# Patient Record
Sex: Male | Born: 1970 | Race: White | Hispanic: No | Marital: Married | Smoking: Never smoker
Health system: Southern US, Community
[De-identification: ages and names within clinical notes are randomized; demographics above are authoritative.]

## PROBLEM LIST (undated history)

## (undated) DIAGNOSIS — F419 Anxiety disorder, unspecified: Secondary | ICD-10-CM

## (undated) DIAGNOSIS — K5792 Diverticulitis of intestine, part unspecified, without perforation or abscess without bleeding: Secondary | ICD-10-CM

## (undated) DIAGNOSIS — K219 Gastro-esophageal reflux disease without esophagitis: Secondary | ICD-10-CM

## (undated) DIAGNOSIS — Z9289 Personal history of other medical treatment: Secondary | ICD-10-CM

## (undated) DIAGNOSIS — E119 Type 2 diabetes mellitus without complications: Secondary | ICD-10-CM

## (undated) DIAGNOSIS — I10 Essential (primary) hypertension: Secondary | ICD-10-CM

## (undated) DIAGNOSIS — E7800 Pure hypercholesterolemia, unspecified: Secondary | ICD-10-CM

## (undated) HISTORY — PX: NO PAST SURGERIES: SHX2092

## (undated) HISTORY — DX: Personal history of other medical treatment: Z92.89

## (undated) HISTORY — DX: Anxiety disorder, unspecified: F41.9

## (undated) HISTORY — DX: Diverticulitis of intestine, part unspecified, without perforation or abscess without bleeding: K57.92

## (undated) HISTORY — DX: Essential (primary) hypertension: I10

## (undated) HISTORY — DX: Gastro-esophageal reflux disease without esophagitis: K21.9

## (undated) HISTORY — DX: Pure hypercholesterolemia, unspecified: E78.00

## (undated) HISTORY — DX: Type 2 diabetes mellitus without complications: E11.9

---

## 2004-03-12 ENCOUNTER — Emergency Department: Payer: Self-pay | Admitting: Unknown Physician Specialty

## 2004-03-12 ENCOUNTER — Other Ambulatory Visit: Payer: Self-pay

## 2007-09-05 ENCOUNTER — Ambulatory Visit: Payer: Self-pay | Admitting: Internal Medicine

## 2008-12-14 ENCOUNTER — Ambulatory Visit: Payer: Self-pay | Admitting: Internal Medicine

## 2011-09-01 ENCOUNTER — Encounter: Payer: Self-pay | Admitting: Internal Medicine

## 2011-09-01 ENCOUNTER — Ambulatory Visit (INDEPENDENT_AMBULATORY_CARE_PROVIDER_SITE_OTHER): Payer: BC Managed Care – PPO | Admitting: Internal Medicine

## 2011-09-01 VITALS — BP 130/98 | HR 75 | Temp 99.2°F | Ht 74.0 in | Wt 262.5 lb

## 2011-09-01 DIAGNOSIS — I10 Essential (primary) hypertension: Secondary | ICD-10-CM

## 2011-09-01 DIAGNOSIS — F411 Generalized anxiety disorder: Secondary | ICD-10-CM

## 2011-09-01 DIAGNOSIS — K219 Gastro-esophageal reflux disease without esophagitis: Secondary | ICD-10-CM | POA: Insufficient documentation

## 2011-09-01 DIAGNOSIS — F419 Anxiety disorder, unspecified: Secondary | ICD-10-CM | POA: Insufficient documentation

## 2011-09-01 DIAGNOSIS — Z23 Encounter for immunization: Secondary | ICD-10-CM

## 2011-09-01 DIAGNOSIS — E785 Hyperlipidemia, unspecified: Secondary | ICD-10-CM

## 2011-09-01 DIAGNOSIS — E669 Obesity, unspecified: Secondary | ICD-10-CM

## 2011-09-01 DIAGNOSIS — Z125 Encounter for screening for malignant neoplasm of prostate: Secondary | ICD-10-CM

## 2011-09-01 LAB — COMPREHENSIVE METABOLIC PANEL
ALT: 29 U/L (ref 0–53)
AST: 23 U/L (ref 0–37)
Albumin: 4.3 g/dL (ref 3.5–5.2)
Alkaline Phosphatase: 56 U/L (ref 39–117)
BUN: 20 mg/dL (ref 6–23)
CO2: 25 mEq/L (ref 19–32)
Calcium: 9.8 mg/dL (ref 8.4–10.5)
Chloride: 103 mEq/L (ref 96–112)
Creatinine, Ser: 1 mg/dL (ref 0.4–1.5)
GFR: 90.68 mL/min (ref >60.00)
Glucose, Bld: 98 mg/dL (ref 70–99)
Potassium: 4.4 mEq/L (ref 3.5–5.1)
Sodium: 139 mEq/L (ref 135–145)
Total Bilirubin: 0.9 mg/dL (ref 0.3–1.2)
Total Protein: 7.1 g/dL (ref 6.0–8.3)

## 2011-09-01 LAB — MICROALBUMIN / CREATININE URINE RATIO
Creatinine,U: 115 mg/dL
Microalb Creat Ratio: 1 mg/g (ref 0.0–30.0)
Microalb, Ur: 1.1 mg/dL (ref 0.0–1.9)

## 2011-09-01 LAB — LIPID PANEL
Cholesterol: 205 mg/dL — ABNORMAL HIGH (ref 0–200)
HDL: 37.9 mg/dL — ABNORMAL LOW (ref >39.00)
Total CHOL/HDL Ratio: 5
Triglycerides: 193 mg/dL — ABNORMAL HIGH (ref 0.0–149.0)
VLDL: 38.6 mg/dL (ref 0.0–40.0)

## 2011-09-01 LAB — PSA: PSA: 0.35 ng/mL (ref 0.10–4.00)

## 2011-09-01 LAB — LDL CHOLESTEROL, DIRECT: Direct LDL: 122.2 mg/dL

## 2011-09-01 MED ORDER — ALPRAZOLAM 0.25 MG PO TABS: 0.2500 mg | ORAL_TABLET | Freq: Three times a day (TID) | ORAL | Status: AC | PRN

## 2011-09-01 MED ORDER — ALPRAZOLAM 0.25 MG PO TABS: 0.2500 mg | ORAL_TABLET | Freq: Two times a day (BID) | ORAL | Status: AC | PRN

## 2011-09-01 MED ORDER — LISINOPRIL 20 MG PO TABS: 20.0000 mg | ORAL_TABLET | Freq: Every day | ORAL | Status: DC

## 2011-09-01 MED ORDER — OMEPRAZOLE-SODIUM BICARBONATE 40-1100 MG PO CAPS: 1.0000 | ORAL_CAPSULE | Freq: Every day | ORAL | Status: DC

## 2011-09-01 NOTE — Assessment & Plan Note (Signed)
Blood pressure slightly elevated today. Will try changing to lisinopril 20 mg daily. We'll check renal function with labs today. Patient will e-mail with blood pressures over the next couple of weeks. Followup in one month.

## 2011-09-01 NOTE — Assessment & Plan Note (Signed)
Will check lipids with labs today.  

## 2011-09-01 NOTE — Assessment & Plan Note (Signed)
Patient with history of GERD. Will continue Zegerid as this has worked well for him. Will obtain records on previous evaluation.

## 2011-09-01 NOTE — Patient Instructions (Signed)
[email or web address]

## 2011-09-01 NOTE — Assessment & Plan Note (Signed)
BMI 33. Encouraged efforts at healthy diet including increased fiber and lower saturated fat. Encouraged regular physical activity with a goal of 30 minutes of aerobic exercise daily. Encouraged him to consider using on line application to help monitor caloric intake. Followup one month.

## 2011-09-01 NOTE — Telephone Encounter (Signed)
Rx for Alprazolam called to [facility].

## 2011-09-01 NOTE — Assessment & Plan Note (Signed)
Patient with history of anxiety and panic disorder. Will use Xanax as needed for severe episodes of panic.

## 2011-09-01 NOTE — Progress Notes (Signed)
Subjective:    Patient ID: Todd Willis, male    DOB: September 09, 1970, 41 y.o.   MRN: [id number]  HPI 41 year old male with history of hypertension, anxiety, GERD presents to establish care. In regards to his hypertension, he reports that he is recently taking lisinopril hydrochlorothiazide. He does not regularly check his blood pressure. He denies any chest pain, palpitations, headache. He notes that on this medication he is urinating frequently, especially at night up to every 2 hours. He has not had kidney function checked in at least one year.  In regards to her anxiety, he notes significant stress at work. He has had panic attacks in the past for which she has used Xanax with some improvement. He has also been on SSRIs, but did not tolerate well secondary to side effects. He notes that her anxiety level is improved with increase physical activity.  He notes that recently he has been trying to lose weight and be more physically active. He reports that exercise is limited by his work schedule. He tries to use the elliptical for 30 minutes 3 times per week. He is also trying to improve his diet by increasing fiber and decreasing saturated fat.  Outpatient Encounter Prescriptions as of 09/01/2011  Medication Sig Dispense Refill  . ALPRAZolam (XANAX) 0.25 MG tablet Take 1 tablet (0.25 mg total) by mouth 3 (three) times daily as needed for anxiety.  30 tablet  0  . lisinopril (PRINIVIL,ZESTRIL) 20 MG tablet Take 1 tablet (20 mg total) by mouth daily.  90 tablet  3  . Multiple Vitamin (MULTIVITAMIN) tablet Take 1 tablet by mouth daily.      Marland Kitchen omeprazole-sodium bicarbonate (ZEGERID) 40-1100 MG per capsule Take 1 capsule by mouth daily before breakfast.  30 capsule  11  . DISCONTD: lisinopril-hydrochlorothiazide (PRINZIDE,ZESTORETIC) 10-12.5 MG per tablet Take 1 tablet by mouth daily.        Review of Systems  Constitutional: Negative for fever, chills, activity change, appetite change, fatigue and  unexpected weight change.  Eyes: Negative for visual disturbance.  Respiratory: Negative for cough and shortness of breath.   Cardiovascular: Negative for chest pain, palpitations and leg swelling.  Gastrointestinal: Positive for abdominal pain (intermittent epigastric). Negative for abdominal distention.  Genitourinary: Negative for dysuria, urgency and difficulty urinating.  Musculoskeletal: Negative for arthralgias and gait problem.  Skin: Negative for color change and rash.  Hematological: Negative for adenopathy.  Psychiatric/Behavioral: Negative for disturbed wake/sleep cycle and dysphoric mood. The patient is nervous/anxious.    BP 130/98  Pulse 75  Temp 99.2 F (37.3 C) (Oral)  Ht 6\' 2"  (1.88 m)  Wt 262 lb 8 oz (119.069 kg)  BMI 33.70 kg/m2  SpO2 98%     Objective:   Physical Exam  Constitutional: He is oriented to person, place, and time. He appears well-developed and well-nourished. No distress.  HENT:  Head: Normocephalic and atraumatic.  Right Ear: External ear normal.  Left Ear: External ear normal.  Nose: Nose normal.  Mouth/Throat: Oropharynx is clear and moist. No oropharyngeal exudate.  Eyes: Conjunctivae and EOM are normal. Pupils are equal, round, and reactive to light. Right eye exhibits no discharge. Left eye exhibits no discharge. No scleral icterus.  Neck: Normal range of motion. Neck supple. No tracheal deviation present. No thyromegaly present.  Cardiovascular: Normal rate, regular rhythm and normal heart sounds.  Exam reveals no gallop and no friction rub.   No murmur heard. Pulmonary/Chest: Effort normal and breath sounds normal. No respiratory distress. He has  no wheezes. He has no rales. He exhibits no tenderness.  Abdominal: Soft. Bowel sounds are normal. He exhibits no distension and no mass. There is no tenderness. There is no guarding.  Musculoskeletal: Normal range of motion. He exhibits no edema.  Lymphadenopathy:    He has no cervical  adenopathy.  Neurological: He is alert and oriented to person, place, and time. No cranial nerve deficit. Coordination normal.  Skin: Skin is warm and dry. No rash noted. He is not diaphoretic. No erythema. No pallor.  Psychiatric: He has a normal mood and affect. His behavior is normal. Judgment and thought content normal.          Assessment & Plan:

## 2011-10-06 ENCOUNTER — Ambulatory Visit (INDEPENDENT_AMBULATORY_CARE_PROVIDER_SITE_OTHER): Payer: BC Managed Care – PPO | Admitting: Internal Medicine

## 2011-10-06 ENCOUNTER — Encounter: Payer: Self-pay | Admitting: Internal Medicine

## 2011-10-06 VITALS — BP 132/90 | HR 71 | Temp 99.0°F | Ht 74.0 in | Wt 270.0 lb

## 2011-10-06 DIAGNOSIS — E785 Hyperlipidemia, unspecified: Secondary | ICD-10-CM

## 2011-10-06 DIAGNOSIS — I10 Essential (primary) hypertension: Secondary | ICD-10-CM

## 2011-10-06 NOTE — Progress Notes (Signed)
  Subjective:    Patient ID: Todd Willis, male    DOB: 01/25/1971, 41 y.o.   MRN: [id number]  HPI 41 year old male with history of anxiety and hypertension presents for followup. He reports that he is generally doing well. In regards to his blood pressure, he reports blood pressures at home have typically been 120s over 80s. He denies any headache, palpitations, chest pain. He notes that occasionally after taking his lisinopril he becomes lightheaded briefly. He takes his lisinopril in the morning.  We reviewed recent labs today which showed normal renal and liver function. Triglycerides were slightly elevated. LDL was just above goal at 122.  Outpatient Encounter Prescriptions as of 10/06/2011  Medication Sig Dispense Refill  . lisinopril (PRINIVIL,ZESTRIL) 20 MG tablet Take 1 tablet (20 mg total) by mouth daily.  90 tablet  3  . Multiple Vitamin (MULTIVITAMIN) tablet Take 1 tablet by mouth daily.      Marland Kitchen ALPRAZolam (XANAX) 0.25 MG tablet       . omeprazole-sodium bicarbonate (ZEGERID) 40-1100 MG per capsule Take 1 capsule by mouth daily before breakfast.  30 capsule  11    BP 132/90  Pulse 71  Temp 99 F (37.2 C) (Oral)  Ht 6\' 2"  (1.88 m)  Wt 270 lb (122.471 kg)  BMI 34.67 kg/m2  SpO2 98%  Review of Systems  Constitutional: Negative for fever, chills, activity change, appetite change, fatigue and unexpected weight change.  Eyes: Negative for visual disturbance.  Respiratory: Negative for cough and shortness of breath.   Cardiovascular: Negative for chest pain, palpitations and leg swelling.  Gastrointestinal: Negative for abdominal pain and abdominal distention.  Genitourinary: Negative for dysuria, urgency and difficulty urinating.  Musculoskeletal: Negative for arthralgias and gait problem.  Skin: Negative for color change and rash.  Hematological: Negative for adenopathy.  Psychiatric/Behavioral: Negative for disturbed wake/sleep cycle and dysphoric mood. The patient is  nervous/anxious.        Objective:   Physical Exam  Constitutional: He is oriented to person, place, and time. He appears well-developed and well-nourished. No distress.  HENT:  Head: Normocephalic and atraumatic.  Right Ear: External ear normal.  Left Ear: External ear normal.  Nose: Nose normal.  Mouth/Throat: Oropharynx is clear and moist. No oropharyngeal exudate.  Eyes: Conjunctivae and EOM are normal. Pupils are equal, round, and reactive to light. Right eye exhibits no discharge. Left eye exhibits no discharge. No scleral icterus.  Neck: Normal range of motion. Neck supple. No tracheal deviation present. No thyromegaly present.  Cardiovascular: Normal rate, regular rhythm and normal heart sounds.  Exam reveals no gallop and no friction rub.   No murmur heard. Pulmonary/Chest: Effort normal and breath sounds normal. No respiratory distress. He has no wheezes. He has no rales. He exhibits no tenderness.  Musculoskeletal: Normal range of motion. He exhibits no edema.  Lymphadenopathy:    He has no cervical adenopathy.  Neurological: He is alert and oriented to person, place, and time. No cranial nerve deficit. Coordination normal.  Skin: Skin is warm and dry. No rash noted. He is not diaphoretic. No erythema. No pallor.  Psychiatric: He has a normal mood and affect. His behavior is normal. Judgment and thought content normal.          Assessment & Plan:

## 2011-10-06 NOTE — Assessment & Plan Note (Signed)
Blood pressure has been generally well controlled. Will have him try taking his lisinopril at night and see if any improvement in symptoms of occasional lightheadedness. Recent renal function is normal. Follow up 6 months.

## 2011-10-06 NOTE — Assessment & Plan Note (Signed)
LDL just above goal at 122. Discussed modifying diet to increase fiber intake and decrease saturated fat. Discussed increase physical activity with goal of 30 minutes daily. We'll plan to repeat cholesterol in 6 months.

## 2011-11-16 ENCOUNTER — Encounter: Payer: Self-pay | Admitting: Internal Medicine

## 2011-11-29 ENCOUNTER — Ambulatory Visit (INDEPENDENT_AMBULATORY_CARE_PROVIDER_SITE_OTHER): Payer: BC Managed Care – PPO | Admitting: Internal Medicine

## 2011-11-29 ENCOUNTER — Encounter: Payer: Self-pay | Admitting: Internal Medicine

## 2011-11-29 VITALS — BP 142/100 | HR 68 | Temp 97.8°F | Ht 74.0 in | Wt 267.2 lb

## 2011-11-29 DIAGNOSIS — I10 Essential (primary) hypertension: Secondary | ICD-10-CM

## 2011-11-29 MED ORDER — LOSARTAN POTASSIUM 50 MG PO TABS: 50.0000 mg | ORAL_TABLET | Freq: Every day | ORAL | Status: DC

## 2011-11-29 NOTE — Assessment & Plan Note (Signed)
Blood pressure elevated today. Given side effects on lisinopril, will change to losartan 50 mg daily. Patient will e-mail with blood pressure readings. Will check creatinine and potassium with labs next week. Followup one month. If no improvement, would favor getting a renal ultrasound.

## 2011-11-29 NOTE — Progress Notes (Signed)
  Subjective:    Patient ID: Todd Willis, male    DOB: [birth year], 41 y.o.   MRN: [id number]  HPI 41 year old male with history of hypertension, anxiety presents for followup. He reports that recently with use of lisinopril he has been feeling lightheaded or "as if he is floating." When he checks his blood pressure at home it is typically 130 to 140 over 80s. He denies any chest pain, headache, palpitations. He denies any cough with the medication. He otherwise reports she is feeling well. He is following a healthy diet and exercising regularly.  Outpatient Encounter Prescriptions as of 11/29/2011  Medication Sig Dispense Refill  . Multiple Vitamin (MULTIVITAMIN) tablet Take 1 tablet by mouth daily.      Marland Kitchen DISCONTD: lisinopril (PRINIVIL,ZESTRIL) 20 MG tablet Take 1 tablet (20 mg total) by mouth daily.  90 tablet  3  . ALPRAZolam (XANAX) 0.25 MG tablet       . losartan (COZAAR) 50 MG tablet Take 1 tablet (50 mg total) by mouth daily.  30 tablet  6  . omeprazole-sodium bicarbonate (ZEGERID) 40-1100 MG per capsule Take 1 capsule by mouth daily before breakfast.  30 capsule  11  BP 142/100  Pulse 68  Temp 97.8 F (36.6 C) (Oral)  Ht 6\' 2"  (1.88 m)  Wt 267 lb 4 oz (121.224 kg)  BMI 34.31 kg/m2  SpO2 97%   Review of Systems  Constitutional: Negative for fever, chills, activity change, appetite change, fatigue and unexpected weight change.  Eyes: Negative for visual disturbance.  Respiratory: Negative for cough and shortness of breath.   Cardiovascular: Negative for chest pain, palpitations and leg swelling.  Gastrointestinal: Negative for abdominal pain and abdominal distention.  Genitourinary: Negative for dysuria, urgency and difficulty urinating.  Musculoskeletal: Negative for arthralgias and gait problem.  Skin: Negative for color change and rash.  Neurological: Positive for light-headedness.  Hematological: Negative for adenopathy.  Psychiatric/Behavioral: Negative for disturbed  wake/sleep cycle and dysphoric mood. The patient is not nervous/anxious.        Objective:   Physical Exam  Constitutional: He is oriented to person, place, and time. He appears well-developed and well-nourished. No distress.  HENT:  Head: Normocephalic and atraumatic.  Right Ear: External ear normal.  Left Ear: External ear normal.  Nose: Nose normal.  Mouth/Throat: Oropharynx is clear and moist. No oropharyngeal exudate.  Eyes: Conjunctivae normal and EOM are normal. Pupils are equal, round, and reactive to light. Right eye exhibits no discharge. Left eye exhibits no discharge. No scleral icterus.  Neck: Normal range of motion. Neck supple. No tracheal deviation present. No thyromegaly present.  Cardiovascular: Normal rate, regular rhythm and normal heart sounds.  Exam reveals no gallop and no friction rub.   No murmur heard. Pulmonary/Chest: Effort normal and breath sounds normal. No respiratory distress. He has no wheezes. He has no rales. He exhibits no tenderness.  Musculoskeletal: Normal range of motion. He exhibits no edema.  Lymphadenopathy:    He has no cervical adenopathy.  Neurological: He is alert and oriented to person, place, and time. No cranial nerve deficit. Coordination normal.  Skin: Skin is warm and dry. No rash noted. He is not diaphoretic. No erythema. No pallor.  Psychiatric: He has a normal mood and affect. His behavior is normal. Judgment and thought content normal.          Assessment & Plan:

## 2011-12-12 ENCOUNTER — Encounter: Payer: Self-pay | Admitting: Internal Medicine

## 2011-12-12 ENCOUNTER — Other Ambulatory Visit (INDEPENDENT_AMBULATORY_CARE_PROVIDER_SITE_OTHER): Payer: BC Managed Care – PPO

## 2011-12-12 DIAGNOSIS — I10 Essential (primary) hypertension: Secondary | ICD-10-CM

## 2011-12-12 LAB — COMPREHENSIVE METABOLIC PANEL
ALT: 25 U/L (ref 0–53)
AST: 19 U/L (ref 0–37)
Albumin: 4.1 g/dL (ref 3.5–5.2)
Alkaline Phosphatase: 46 U/L (ref 39–117)
BUN: 15 mg/dL (ref 6–23)
CO2: 26 mEq/L (ref 19–32)
Calcium: 9.4 mg/dL (ref 8.4–10.5)
Chloride: 103 mEq/L (ref 96–112)
Creatinine, Ser: 1 mg/dL (ref 0.4–1.5)
GFR: 87.43 mL/min (ref >60.00)
Glucose, Bld: 104 mg/dL — ABNORMAL HIGH (ref 70–99)
Potassium: 4.3 mEq/L (ref 3.5–5.1)
Sodium: 143 mEq/L (ref 135–145)
Total Bilirubin: 0.8 mg/dL (ref 0.3–1.2)
Total Protein: 6.8 g/dL (ref 6.0–8.3)

## 2012-01-05 ENCOUNTER — Encounter: Payer: Self-pay | Admitting: Internal Medicine

## 2012-01-05 ENCOUNTER — Ambulatory Visit (INDEPENDENT_AMBULATORY_CARE_PROVIDER_SITE_OTHER): Payer: BC Managed Care – PPO | Admitting: Internal Medicine

## 2012-01-05 VITALS — BP 126/80 | HR 66 | Temp 98.2°F | Ht 74.0 in | Wt 273.0 lb

## 2012-01-05 DIAGNOSIS — E669 Obesity, unspecified: Secondary | ICD-10-CM

## 2012-01-05 DIAGNOSIS — I10 Essential (primary) hypertension: Secondary | ICD-10-CM

## 2012-01-05 MED ORDER — LOSARTAN POTASSIUM 50 MG PO TABS: 50.0000 mg | ORAL_TABLET | Freq: Every day | ORAL | Status: DC

## 2012-01-05 NOTE — Progress Notes (Signed)
  Subjective:    Patient ID: Todd Willis, male    DOB: 1971/01/28, 41 y.o.   MRN: [id number]  HPI 41YO male with h/o hypertension presents for followup. He reports he has been doing well. Blood pressure at home is typically 120s over 80s. He reports full compliance with her losartan. He denies any side effects from this medication. He denies any new concerns today. In terms of weight loss, he reports some dietary indiscretion, particularly for sweets. He has been making effort to get regular physical activity.  Outpatient Encounter Prescriptions as of 01/05/2012  Medication Sig Dispense Refill  . ALPRAZolam (XANAX) 0.25 MG tablet       . losartan (COZAAR) 50 MG tablet Take 1 tablet (50 mg total) by mouth daily.  30 tablet  11  . DISCONTD: losartan (COZAAR) 50 MG tablet Take 1 tablet (50 mg total) by mouth daily.  30 tablet  6  . Multiple Vitamin (MULTIVITAMIN) tablet Take 1 tablet by mouth daily.      Marland Kitchen omeprazole-sodium bicarbonate (ZEGERID) 40-1100 MG per capsule Take 1 capsule by mouth daily before breakfast.  30 capsule  11   BP 126/80  Pulse 66  Temp 98.2 F (36.8 C) (Oral)  Ht 6\' 2"  (1.88 m)  Wt 273 lb (123.832 kg)  BMI 35.05 kg/m2  SpO2 98%  Review of Systems  Constitutional: Negative for fever, chills, activity change, appetite change, fatigue and unexpected weight change.  Eyes: Negative for visual disturbance.  Respiratory: Negative for cough and shortness of breath.   Cardiovascular: Negative for chest pain, palpitations and leg swelling.  Gastrointestinal: Negative for abdominal pain and abdominal distention.  Genitourinary: Negative for dysuria, urgency and difficulty urinating.  Musculoskeletal: Negative for arthralgias and gait problem.  Skin: Negative for color change and rash.  Hematological: Negative for adenopathy.  Psychiatric/Behavioral: Negative for disturbed wake/sleep cycle and dysphoric mood. The patient is not nervous/anxious.        Objective:   Physical Exam  Constitutional: He is oriented to person, place, and time. He appears well-developed and well-nourished. No distress.  HENT:  Head: Normocephalic and atraumatic.  Right Ear: External ear normal.  Left Ear: External ear normal.  Nose: Nose normal.  Mouth/Throat: Oropharynx is clear and moist. No oropharyngeal exudate.  Eyes: Conjunctivae normal and EOM are normal. Pupils are equal, round, and reactive to light. Right eye exhibits no discharge. Left eye exhibits no discharge. No scleral icterus.  Neck: Normal range of motion. Neck supple. No tracheal deviation present. No thyromegaly present.  Cardiovascular: Normal rate, regular rhythm and normal heart sounds.  Exam reveals no gallop and no friction rub.   No murmur heard. Pulmonary/Chest: Effort normal and breath sounds normal. No respiratory distress. He has no wheezes. He has no rales. He exhibits no tenderness.  Musculoskeletal: Normal range of motion. He exhibits no edema.  Lymphadenopathy:    He has no cervical adenopathy.  Neurological: He is alert and oriented to person, place, and time. No cranial nerve deficit. Coordination normal.  Skin: Skin is warm and dry. No rash noted. He is not diaphoretic. No erythema. No pallor.  Psychiatric: He has a normal mood and affect. His behavior is normal. Judgment and thought content normal.          Assessment & Plan:

## 2012-01-05 NOTE — Assessment & Plan Note (Signed)
BP well controlled on Losartan. Will continue. Will follow up in 6 months or sooner as needed.

## 2012-01-05 NOTE — Assessment & Plan Note (Signed)
Encouraged better compliance with diet low in processed carbohydrates and regular physical activity. Follow up 6 months and prn.

## 2012-01-15 ENCOUNTER — Telehealth: Payer: Self-pay | Admitting: Internal Medicine

## 2012-01-15 NOTE — Telephone Encounter (Signed)
Caller: Pam/Spouse; Patient Name: Todd Willis; PCP: Ronna Polio (Adults only); Best Callback Phone Number: [phone]; Reason for call: Headache duration of 20 minutes with pixelated vision.  Onset of event:  13:30-14:15.  Pt. went immediately to his opthalmologist who ruled out retinal detachement and told pt. to inform PCP.  Pt.s symptoms have not returned and pt. feels normal at this time.  BP in office:  131/95.  Triaged per QMV:HQIO or Vision Change, and the Disposition for:  Brief marked or complete vision loss in one or both eyes and now resolved:  Call Provider Immediately.  Contacted Dr. Lovell Sheehan and received following direction: " If pt. has any further vision changes tonight, go to ED immediately, otherwise pt. needs to be seen in office tomorrow."  NO APPOINTMENTS ARE AVAILABLE TOMORROW,  PLEASE WORK THIS PT. IN PER DR. Lovell Sheehan DIRECTION.  Home care instructions given.  db/CAN.

## 2012-01-15 NOTE — Telephone Encounter (Signed)
Please contact pt. At [phone] (M)

## 2012-01-16 ENCOUNTER — Ambulatory Visit (INDEPENDENT_AMBULATORY_CARE_PROVIDER_SITE_OTHER): Payer: BC Managed Care – PPO | Admitting: Internal Medicine

## 2012-01-16 ENCOUNTER — Encounter: Payer: Self-pay | Admitting: Internal Medicine

## 2012-01-16 VITALS — BP 142/80 | HR 77 | Temp 98.6°F | Resp 12 | Ht 74.0 in | Wt 270.0 lb

## 2012-01-16 DIAGNOSIS — I10 Essential (primary) hypertension: Secondary | ICD-10-CM

## 2012-01-16 DIAGNOSIS — R51 Headache: Secondary | ICD-10-CM

## 2012-01-16 DIAGNOSIS — E669 Obesity, unspecified: Secondary | ICD-10-CM

## 2012-01-16 DIAGNOSIS — R519 Headache, unspecified: Secondary | ICD-10-CM

## 2012-01-16 DIAGNOSIS — H539 Unspecified visual disturbance: Secondary | ICD-10-CM

## 2012-01-16 DIAGNOSIS — E785 Hyperlipidemia, unspecified: Secondary | ICD-10-CM

## 2012-01-16 DIAGNOSIS — E66811 Obesity, class 1: Secondary | ICD-10-CM

## 2012-01-16 MED ORDER — AMLODIPINE BESYLATE 5 MG PO TABS: 5.0000 mg | ORAL_TABLET | Freq: Every day | ORAL | Status: DC

## 2012-01-16 MED ORDER — ASPIRIN EC 81 MG PO TBEC: 81.0000 mg | DELAYED_RELEASE_TABLET | Freq: Every day | ORAL | Status: AC

## 2012-01-16 NOTE — Telephone Encounter (Signed)
I don't have any appt left today and will be out this afternoon. Dr. Darrick Huntsman has an appointment at 2pm

## 2012-01-16 NOTE — Progress Notes (Signed)
Patient ID: Todd Willis, male   DOB: 22-Jul-1970, 41 y.o.   MRN: [id number] Patient Active Problem List  Diagnosis  . Hypertension  . Hyperlipidemia LDL goal < 100  . Screening for prostate cancer  . Anxiety  . GERD (gastroesophageal reflux disease)  . Obesity (BMI 30-39.9)  . New onset headache    Subjective:  CC:   Chief Complaint  Patient presents with  . Headache    HPI:   Todd Willis a 41 y.o. male who presents with  Sudden onset of visual changes which occurred while driving.  Accompanied by occipital headache . Visual changes affected Predominantly left eye but also in right if closes one eye.  Urgent retinal exam by eye doctor was normal.   Opined that migraine was the caused.  Bps were slightly elevated since then .  History of hypertension,  borderline lipids.  No new medications since June , but since he started the losartan has had recurrent occipital headaches .  Headaches are dull occipital  nonthrobbing ,  Occurring 3 or 4 times per week.  Also occurring in the right temple area.  No daily aspirin  . Has FH of CVA Dad at age 60 first MI at age 91.  Died at 27 . Maternal grandparents had aneurysms , presumed  cerebral.  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   Past Medical History  Diagnosis Date  . Diverticulitis   . GERD (gastroesophageal reflux disease)     uses zegrid occas  . H/O cardiovascular stress test     normal per pt  . History of MRI of brain and brain stem     Normal per pt  . Anxiety     on buspar and xanax in past    No past surgical history on file.       The following portions of the patient's history were reviewed and updated as appropriate: Allergies, current medications, and problem list.    Review of Systems:   12 Pt  review of systems was negative except those addressed in the HPI,     History   Social History  . Marital Status: Married    Spouse Name: N/A      Number of Children: N/A  . Years of Education: N/A   Occupational History  . Not on file.   Social History Main Topics  . Smoking status: Never Smoker   . Smokeless tobacco: Not on file  . Alcohol Use: Not on file  . Drug Use: Not on file  . Sexually Active: Not on file   Other Topics Concern  . 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 ROM normal. No CVA tenderness. Lungs: clear to auscultation bilaterally Heart: regular rate and rhythm, S1, S2 normal,  no murmur, click, rub or gallop Abdomen: soft, non-tender; bowel sounds normal; no masses,  no organomegaly Pulses: 2+ and symmetric Skin: Skin color, texture, turgor normal. No rashes or lesions Lymph nodes: Cervical, supraclavicular, and axillary nodes normal. Neuro: grossly nonfocal including visual fields and cerebellar function   Assessment and Plan:  Hypertension He has not felt well since his bp medication was change in July to losartan.  Given his new onset recurrent headaches,  Will initiate trial of amlodipine at 5 mg daily as alternative.  Hyperlipidemia LDL goal < 100 With triglycerides >150, HFL < 40.,  BMI 43.  Low GI index diet discussed. Handout given goal is wt loss of 35 lbs initially to lower BMI to < 30  Obesity (BMI 30-39.9) Low GI index diet discussed. Handout  given goal is wt loss of 35 lbs initially to lower BMI to < 30   New onset headache With visual changes not accompanied by optic nerve ischemia or retinal detachment  per urgent ophthalmologic exam done  10/28.  Given his FH of CVA and aneurysms,  MRI/MRA ordered.  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   Updated Medication List Outpatient Encounter Prescriptions as of 01/16/2012  Medication Sig Dispense Refill  . Multiple Vitamin (MULTIVITAMIN) tablet Take 1 tablet by mouth daily.      Marland Kitchen DISCONTD: losartan (COZAAR) 50 MG tablet Take 1 tablet (50 mg total) by mouth daily.  30 tablet  11  . ALPRAZolam (XANAX) 0.25 MG tablet       . amLODipine (NORVASC) 5 MG tablet Take 1 tablet (5 mg total) by mouth daily.  90 tablet  3  . aspirin EC 81 MG tablet Take 1 tablet (81 mg total) by mouth daily.  30 tablet  11  . omeprazole-sodium bicarbonate (ZEGERID) 40-1100 MG per capsule Take 1 capsule by mouth daily before breakfast.  30 capsule  11     Orders Placed This Encounter  Procedures  . MR MRA Head/Brain Wo Cm    No Follow-up on file.

## 2012-01-16 NOTE — Telephone Encounter (Signed)
Patient is seeing Dr. Darrick Huntsman this afternoon.

## 2012-01-16 NOTE — Patient Instructions (Addendum)
I am ordering an MRI/MRA of brain to investigate headache pattern and rule out aneurysms   I do recommend baby aspirin  Given tne fh of strokes   This is  Dr. Melina Schools version of a  "Low GI"  Diet:  All of the foods can be found at grocery stores and in bulk at [employer].  The Atkins protein bars and shakes are available in more varieties at [employer], [employer] and [employer].     7 AM Breakfast:  Low carbohydrate Protein  Shakes (I recommend the EAS AdvantEdge "Carb Control" shakes  Or the low carb shakes by Atkins.   Both are available everywhere:  In  cases at [employer]Js  Or in 4 packs at grocery stores and pharmacies  2.5 carbs  (Alternative is  a toasted Arnold's Sandwhich Thin w/ peanut butter, a "Bagel Thin" with cream cheese and salmon) or  a scrambled egg burrito made with a low carb tortilla .  Avoid cereal and bananas, oatmeal too unless you are cooking the old fashioned kind that takes 30-40 minutes to prepare.  the rest is overly processed, has minimal fiber, and is loaded with carbohydrates!   10 AM: Protein bar by Atkins (the snack size, under 200 cal).  There are many varieties , available widely again or in bulk in limited varieties at [employer]Js)  Other so called "protein bars" tend to be loaded with carbohydrates.  Remember, in food advertising, the word "energy" is synonymous for " carbohydrate."  Lunch: sandwich of [place], (or any lunchmeat, grilled meat or canned tuna), fresh avocado, mayonnaise  and cheese on a lower carbohydrate pita bread, flatbread, or tortilla . Ok to use regular mayonnaise. The bread is the only source or carbohydrate that can be decreased (Joseph's makes a pita bread and a flat bread that are 50 cal and 4 net carbs ; Toufayan makes a low carb flatbread that's 100 cal and 9 net carbs  and  Mission makes a low carb whole wheat tortilla  That is 210 cal and 6 net carbs)  3 PM:  Mid day :  Another protein bar,  Or a  cheese stick (100 cal, 0 carbs),  Or 1 ounce of  almonds,  walnuts, pistachios, pecans, peanuts,  Macadamia nuts. Or a Dannon light n Fit greek yogurt, 80 cal 8 net carbs . Avoid "granola"; the dried cranberries and raisins are loaded with carbohydrates. Mixed nuts ok if no raisins or cranberries or dried fruit.      6 PM  Dinner:  "mean and green:"  Meat/chicken/fish or a high protein legume; , with a green salad, and a low GI  Veggie (broccoli, cauliflower, green beans, spinach, brussel sprouts. Lima beans) : Avoid "Low fat dressings, as well as Reyne Dumas and [address]! They are loaded with sugar! Instead use ranch, vinagrette,  Blue cheese, etc  9 PM snack : Breyer's "low carb" fudgsicle or  ice cream bar (Carb Smart line), or  Weight Watcher's ice cream bar , or another "no sugar added" ice cream;a serving of fresh berries/cherries with whipped cream (Avoid bananas, pineapple, grapes  and watermelon on a regular basis because they are high in sugar)   Remember that snack Substitutions should be less than 15 to 20 carbs  Per serving. Remember to subtract fiber grams and sugar alcohols to get the "net carbs."

## 2012-01-16 NOTE — Telephone Encounter (Signed)
Patient has called in again this morning, I don't know what to tell him please advise.

## 2012-01-16 NOTE — Assessment & Plan Note (Signed)
With triglycerides >150, HFL < 40.,  BMI 43.  Low GI index diet discussed. Handout given goal is wt loss of 35 lbs initially to lower BMI to < 30

## 2012-01-16 NOTE — Assessment & Plan Note (Signed)
He has not felt well since his bp medication was change in July to losartan.  Given his new onset recurrent headaches,  Will initiate trial of amlodipine at 5 mg daily as alternative.

## 2012-01-16 NOTE — Assessment & Plan Note (Signed)
Low GI index diet discussed. Handout given goal is wt loss of 35 lbs initially to lower BMI to < 30

## 2012-01-16 NOTE — Assessment & Plan Note (Signed)
With visual changes not accompanied by optic nerve ischemia or retinal detachment  per urgent ophthalmologic exam done  10/28.  Given his FH of CVA and aneurysms,  MRI/MRA ordered.  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

## 2012-01-17 ENCOUNTER — Other Ambulatory Visit: Payer: Self-pay | Admitting: Internal Medicine

## 2012-01-17 ENCOUNTER — Telehealth: Payer: Self-pay | Admitting: Internal Medicine

## 2012-01-17 DIAGNOSIS — R51 Headache: Secondary | ICD-10-CM

## 2012-01-17 DIAGNOSIS — R519 Headache, unspecified: Secondary | ICD-10-CM

## 2012-01-17 DIAGNOSIS — I10 Essential (primary) hypertension: Secondary | ICD-10-CM

## 2012-01-17 DIAGNOSIS — H539 Unspecified visual disturbance: Secondary | ICD-10-CM

## 2012-01-17 NOTE — Addendum Note (Signed)
Addended by: Sherlene Shams on: 01/17/2012 02:44 PM   Modules accepted: Orders

## 2012-01-18 ENCOUNTER — Other Ambulatory Visit (HOSPITAL_COMMUNITY): Payer: BC Managed Care – PPO

## 2012-01-18 ENCOUNTER — Ambulatory Visit (HOSPITAL_COMMUNITY): Admission: RE | Admit: 2012-01-18 | Payer: BC Managed Care – PPO | Source: Ambulatory Visit

## 2012-01-23 ENCOUNTER — Ambulatory Visit (HOSPITAL_COMMUNITY)
Admission: RE | Admit: 2012-01-23 | Discharge: 2012-01-23 | Payer: BC Managed Care – PPO | Source: Ambulatory Visit | Attending: Internal Medicine | Admitting: Internal Medicine

## 2012-01-23 ENCOUNTER — Ambulatory Visit (HOSPITAL_COMMUNITY)
Admission: RE | Admit: 2012-01-23 | Discharge: 2012-01-23 | Disposition: A | Discharge status: Home / Self Care | Payer: BC Managed Care – PPO | Source: Ambulatory Visit | Attending: Internal Medicine | Admitting: Internal Medicine

## 2012-01-23 DIAGNOSIS — I10 Essential (primary) hypertension: Secondary | ICD-10-CM | POA: Insufficient documentation

## 2012-01-23 DIAGNOSIS — R519 Headache, unspecified: Secondary | ICD-10-CM

## 2012-01-23 DIAGNOSIS — H539 Unspecified visual disturbance: Secondary | ICD-10-CM

## 2012-01-23 DIAGNOSIS — R51 Headache: Secondary | ICD-10-CM

## 2012-01-23 DIAGNOSIS — R42 Dizziness and giddiness: Secondary | ICD-10-CM | POA: Insufficient documentation

## 2012-02-09 ENCOUNTER — Ambulatory Visit (INDEPENDENT_AMBULATORY_CARE_PROVIDER_SITE_OTHER): Payer: BC Managed Care – PPO | Admitting: Internal Medicine

## 2012-02-09 ENCOUNTER — Encounter: Payer: Self-pay | Admitting: Internal Medicine

## 2012-02-09 VITALS — BP 132/74 | HR 103 | Temp 99.5°F | Ht 75.0 in | Wt 269.5 lb

## 2012-02-09 DIAGNOSIS — J069 Acute upper respiratory infection, unspecified: Secondary | ICD-10-CM

## 2012-02-09 DIAGNOSIS — Z139 Encounter for screening, unspecified: Secondary | ICD-10-CM

## 2012-02-09 LAB — POCT INFLUENZA A/B
Influenza A, POC: NEGATIVE
Influenza B, POC: NEGATIVE

## 2012-02-09 MED ORDER — AMOXICILLIN-POT CLAVULANATE 875-125 MG PO TABS: 1.0000 | ORAL_TABLET | Freq: Two times a day (BID) | ORAL | Status: DC

## 2012-02-09 NOTE — Progress Notes (Signed)
Patient ID: Todd Willis, male   DOB: [birth year], 41 y.o.   MRN: [id number]  Patient Active Problem List  Diagnosis  . Hypertension  . Hyperlipidemia LDL goal < 100  . Screening for prostate cancer  . Anxiety  . GERD (gastroesophageal reflux disease)  . Obesity (BMI 30-39.9)  . New onset headache  . Acute URI    Subjective:  CC:   Chief Complaint  Patient presents with  . Cough    HPI:   Todd Willis a 41 y.o. male who presents with an URI.  low grade fever started yesterday,  Subjective chills,  Fever to 101.  Mild Body aches in legs and joints,  Lower back.  Dry cough frequent, not paroxysmal.  Sinuses are congested. Not a lot sneezing.    appetite is fine,  No nausea or vomiting.  Had flu shot., Has had multiple sick contacts   Past Medical History  Diagnosis Date  . Diverticulitis   . GERD (gastroesophageal reflux disease)     uses zegrid occas  . H/O cardiovascular stress test     normal per pt  . History of MRI of brain and brain stem     Normal per pt  . Anxiety     on buspar and xanax in past    History reviewed. No pertinent past surgical history.   The following portions of the patient's history were reviewed and updated as appropriate: Allergies, current medications, and problem list.    Review of Systems:   12 Pt  review of systems was negative except those addressed in the HPI,     History   Social History  . Marital Status: Married    Spouse Name: N/A    Number of Children: N/A  . Years of Education: N/A   Occupational History  . Not on file.   Social History Main Topics  . Smoking status: Never Smoker   . Smokeless tobacco: Not on file  . Alcohol Use: Not on file  . Drug Use: Not on file  . Sexually Active: Not on file   Other Topics Concern  . 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 ROM normal. No CVA tenderness. Lungs: clear to auscultation bilaterally Heart: regular rate and rhythm, S1, S2 normal, no murmur, click, rub or gallop Abdomen: soft, non-tender; bowel sounds normal; no masses,  no organomegaly Pulses: 2+ and symmetric Skin: Skin color, texture, turgor normal. No rashes or lesions Lymph nodes: Cervical, supraclavicular, and axillary nodes normal.  Assessment and Plan:  Acute URI This URI is most likely viral given the exam and history Influenza test was negative.  I have explained that in viral URIS, an antibiotic will not help the symptoms and will increase the risk of developing diarrhea.,  Continue oral and nasal decongestants,  Ibuprofen 400 mg and tylenol  650 mq 8 hrs for aches and pains,  OTC Delsym q 8 hours prn cough  And bx only if symptoms worsen to include fevers, facial pain, purulent sputum./drainage.    Updated Medication List Outpatient Encounter Prescriptions as of 02/09/2012  Medication Sig Dispense Refill  . ALPRAZolam (XANAX) 0.25 MG tablet       . amLODipine (NORVASC) 5 MG tablet Take 1 tablet (5 mg total) by mouth daily.  90 tablet  3  . aspirin EC 81 MG tablet Take 1 tablet (81 mg total) by mouth daily.  30 tablet  11  . Multiple Vitamin (MULTIVITAMIN) tablet Take 1 tablet by mouth daily.      Marland Kitchen amoxicillin-clavulanate (AUGMENTIN) 875-125 MG per tablet Take 1 tablet by mouth 2 (two) times daily.  14 tablet  0  .  omeprazole-sodium bicarbonate (ZEGERID) 40-1100 MG per capsule Take 1 capsule by mouth daily before breakfast.  30 capsule  11     Orders Placed This Encounter  Procedures  . POCT Influenza A/B    No Follow-up on file.

## 2012-02-09 NOTE — Patient Instructions (Addendum)
You have a viral  Syndrome .  Your flu test was negative.    The post nasal drip is causing your sore throat.  Flush your sinuses twice daly with Simply saline nasal spray.  You can use benadryl 25 mg every 8 hours and Sudafed PE 10 to 30 every 8 hours to manage the drainage and congestion.  Gargle with salt water as needed for the sore throat.  Use OTC Delsym for a dry cough,  Or Robitussin DM if you feel that you need to expectorate  If you develop brown or green nasal discharge,  facial pain,  Start the antibiotic.

## 2012-02-11 ENCOUNTER — Encounter: Payer: Self-pay | Admitting: Internal Medicine

## 2012-02-11 DIAGNOSIS — J069 Acute upper respiratory infection, unspecified: Secondary | ICD-10-CM | POA: Insufficient documentation

## 2012-02-11 NOTE — Assessment & Plan Note (Signed)
This URI is most likely viral given the exam and history Influenza test was negative.  I have explained that in viral URIS, an antibiotic will not help the symptoms and will increase the risk of developing diarrhea.,  Continue oral and nasal decongestants,  Ibuprofen 400 mg and tylenol 650 mq 8 hrs for aches and pains,  OTC Delsym q 8 hours prn cough  And bx only if symptoms worsen to include fevers, facial pain, purulent sputum./drainage.

## 2012-02-13 ENCOUNTER — Encounter: Payer: Self-pay | Admitting: Internal Medicine

## 2012-04-12 ENCOUNTER — Ambulatory Visit: Payer: BC Managed Care – PPO | Admitting: Internal Medicine

## 2012-07-12 ENCOUNTER — Telehealth: Payer: Self-pay | Admitting: *Deleted

## 2012-07-12 NOTE — Telephone Encounter (Signed)
Pt is coming in Monday 04.28.2014 for labs what labs and dx code would you like?  Thank you   

## 2012-07-14 NOTE — Telephone Encounter (Signed)
CMP, Lipids, CBC, TSH, Vit D - V70.0 Urine microalbumin/cr 401.9

## 2012-07-15 ENCOUNTER — Other Ambulatory Visit (INDEPENDENT_AMBULATORY_CARE_PROVIDER_SITE_OTHER): Payer: BC Managed Care – PPO

## 2012-07-15 DIAGNOSIS — Z0000 Encounter for general adult medical examination without abnormal findings: Secondary | ICD-10-CM

## 2012-07-15 LAB — COMPREHENSIVE METABOLIC PANEL
ALT: 44 U/L (ref 0–53)
AST: 34 U/L (ref 0–37)
Albumin: 4.2 g/dL (ref 3.5–5.2)
Alkaline Phosphatase: 50 U/L (ref 39–117)
BUN: 18 mg/dL (ref 6–23)
CO2: 32 mEq/L (ref 19–32)
Calcium: 9.3 mg/dL (ref 8.4–10.5)
Chloride: 105 mEq/L (ref 96–112)
Creatinine, Ser: 1.1 mg/dL (ref 0.4–1.5)
GFR: 81.51 mL/min (ref >60.00)
Glucose, Bld: 89 mg/dL (ref 70–99)
Potassium: 4.3 mEq/L (ref 3.5–5.1)
Sodium: 139 mEq/L (ref 135–145)
Total Bilirubin: 1.1 mg/dL (ref 0.3–1.2)
Total Protein: 7.1 g/dL (ref 6.0–8.3)

## 2012-07-15 LAB — LIPID PANEL
Cholesterol: 194 mg/dL (ref 0–200)
HDL: 35.6 mg/dL — ABNORMAL LOW (ref >39.00)
LDL Cholesterol: 133 mg/dL — ABNORMAL HIGH (ref 0–99)
Total CHOL/HDL Ratio: 5
Triglycerides: 129 mg/dL (ref 0.0–149.0)
VLDL: 25.8 mg/dL (ref 0.0–40.0)

## 2012-07-15 LAB — CBC WITH DIFFERENTIAL/PLATELET
Basophils Absolute: 0 10*3/uL (ref 0.0–0.1)
Basophils Relative: 0.3 % (ref 0.0–3.0)
Eosinophils Absolute: 0.2 10*3/uL (ref 0.0–0.7)
Eosinophils Relative: 3.3 % (ref 0.0–5.0)
HCT: 42 % (ref 39.0–52.0)
Hemoglobin: 15.1 g/dL (ref 13.0–17.0)
Lymphocytes Relative: 28.4 % (ref 12.0–46.0)
Lymphs Abs: 1.7 10*3/uL (ref 0.7–4.0)
MCHC: 35.9 g/dL (ref 30.0–36.0)
MCV: 84 fl (ref 78.0–100.0)
Monocytes Absolute: 0.4 10*3/uL (ref 0.1–1.0)
Monocytes Relative: 6.5 % (ref 3.0–12.0)
Neutro Abs: 3.6 10*3/uL (ref 1.4–7.7)
Neutrophils Relative %: 61.5 % (ref 43.0–77.0)
Platelets: 211 10*3/uL (ref 150.0–400.0)
RBC: 5 Mil/uL (ref 4.22–5.81)
RDW: 13.4 % (ref 11.5–14.6)
WBC: 5.8 10*3/uL (ref 4.5–10.5)

## 2012-07-15 LAB — MICROALBUMIN / CREATININE URINE RATIO
Creatinine,U: 154.5 mg/dL
Microalb Creat Ratio: 0.9 mg/g (ref 0.0–30.0)
Microalb, Ur: 1.4 mg/dL (ref 0.0–1.9)

## 2012-07-15 LAB — TSH: TSH: 1.42 u[IU]/mL (ref 0.35–5.50)

## 2012-07-16 ENCOUNTER — Encounter: Payer: Self-pay | Admitting: Internal Medicine

## 2012-07-16 ENCOUNTER — Other Ambulatory Visit (HOSPITAL_COMMUNITY)
Admission: RE | Admit: 2012-07-16 | Discharge: 2012-07-16 | Disposition: A | Discharge status: Home / Self Care | Payer: BC Managed Care – PPO | Source: Ambulatory Visit | Attending: Internal Medicine | Admitting: Internal Medicine

## 2012-07-16 ENCOUNTER — Ambulatory Visit (INDEPENDENT_AMBULATORY_CARE_PROVIDER_SITE_OTHER): Payer: BC Managed Care – PPO | Admitting: Internal Medicine

## 2012-07-16 VITALS — BP 138/106 | HR 86 | Temp 99.0°F | Wt 276.0 lb

## 2012-07-16 DIAGNOSIS — E785 Hyperlipidemia, unspecified: Secondary | ICD-10-CM

## 2012-07-16 DIAGNOSIS — I10 Essential (primary) hypertension: Secondary | ICD-10-CM

## 2012-07-16 DIAGNOSIS — R350 Frequency of micturition: Secondary | ICD-10-CM | POA: Insufficient documentation

## 2012-07-16 DIAGNOSIS — E669 Obesity, unspecified: Secondary | ICD-10-CM

## 2012-07-16 DIAGNOSIS — R319 Hematuria, unspecified: Secondary | ICD-10-CM | POA: Insufficient documentation

## 2012-07-16 LAB — POCT URINALYSIS DIPSTICK
Bilirubin, UA: NEGATIVE
Glucose, UA: NEGATIVE
Ketones, UA: NEGATIVE
Leukocytes, UA: NEGATIVE
Nitrite, UA: NEGATIVE
Protein, UA: NEGATIVE
Spec Grav, UA: >=1.03
Urobilinogen, UA: 0.2
pH, UA: 5.5

## 2012-07-16 LAB — VITAMIN D 25 HYDROXY (VIT D DEFICIENCY, FRACTURES): Vit D, 25-Hydroxy: 32 ng/mL (ref 30–89)

## 2012-07-16 MED ORDER — LOSARTAN POTASSIUM 50 MG PO TABS: 50.0000 mg | ORAL_TABLET | Freq: Every day | ORAL | Status: DC

## 2012-07-16 NOTE — Progress Notes (Signed)
Subjective:    Patient ID: Todd Willis, male    DOB: January 30, 1971, 42 y.o.   MRN: [id number]  HPI 42 year old male with history of hypertension presents for followup. In regards to blood pressure, he reports blood pressure has been more elevated on amlodipine. He was changed from losartan to amlodipine to better control headaches. He reports no change in headaches, however blood pressure has been more elevated typically near 140/100. He denies any chest pain or palpitations. He would like to change back to losartan.  He is concerned about increased urinary frequency. He reports that at night he wakes several times to urinate. This has not seemed to occur during the day. He has tried limiting fluid intake at night and limiting intake of caffeine with no improvement. He denies dysuria, hematuria, flank pain, fever, chills.  In regards to recent history of headaches, he reports that intensity of headaches has improved. He occasionally has tension-type headache pain in the back of his head which resolves without any intervention. This is described as mild.  Outpatient Encounter Prescriptions as of 07/16/2012  Medication Sig Dispense Refill  . aspirin EC 81 MG tablet Take 1 tablet (81 mg total) by mouth daily.  30 tablet  11  . Multiple Vitamin (MULTIVITAMIN) tablet Take 1 tablet by mouth daily.      Marland Kitchen omeprazole-sodium bicarbonate (ZEGERID) 40-1100 MG per capsule Take 1 capsule by mouth daily before breakfast.  30 capsule  11  . [DISCONTINUED] amLODipine (NORVASC) 5 MG tablet Take 1 tablet (5 mg total) by mouth daily.  90 tablet  3  . ALPRAZolam (XANAX) 0.25 MG tablet       . losartan (COZAAR) 50 MG tablet Take 1 tablet (50 mg total) by mouth daily.  90 tablet  4   No facility-administered encounter medications on file as of 07/16/2012.   BP 138/106  Pulse 86  Temp(Src) 99 F (37.2 C) (Oral)  Wt 276 lb (125.193 kg)  BMI 34.5 kg/m2  SpO2 97%  Review of Systems  Constitutional: Positive for  fatigue. Negative for fever, chills, activity change, appetite change and unexpected weight change.  Eyes: Negative for visual disturbance.  Respiratory: Negative for cough and shortness of breath.   Cardiovascular: Negative for chest pain, palpitations and leg swelling.  Gastrointestinal: Negative for abdominal pain and abdominal distention.  Genitourinary: Positive for frequency. Negative for dysuria, urgency, hematuria, flank pain, decreased urine volume, scrotal swelling, difficulty urinating, genital sores, penile pain and testicular pain.  Musculoskeletal: Negative for arthralgias and gait problem.  Skin: Negative for color change and rash.  Hematological: Negative for adenopathy.  Psychiatric/Behavioral: Negative for sleep disturbance and dysphoric mood. The patient is not nervous/anxious.        Objective:   Physical Exam  Constitutional: He is oriented to person, place, and time. He appears well-developed and well-nourished. No distress.  HENT:  Head: Normocephalic and atraumatic.  Right Ear: External ear normal.  Left Ear: External ear normal.  Nose: Nose normal.  Mouth/Throat: Oropharynx is clear and moist. No oropharyngeal exudate.  Eyes: Conjunctivae and EOM are normal. Pupils are equal, round, and reactive to light. Right eye exhibits no discharge. Left eye exhibits no discharge. No scleral icterus.  Neck: Normal range of motion. Neck supple. No tracheal deviation present. No thyromegaly present.  Cardiovascular: Normal rate, regular rhythm and normal heart sounds.  Exam reveals no gallop and no friction rub.   No murmur heard. Pulmonary/Chest: Effort normal and breath sounds normal. No accessory muscle usage.  Not tachypneic. No respiratory distress. He has no decreased breath sounds. He has no wheezes. He has no rhonchi. He has no rales. He exhibits no tenderness.  Abdominal: Soft. Bowel sounds are normal. He exhibits no distension. There is no tenderness.  Genitourinary:  Rectum normal. Rectal exam shows no external hemorrhoid, no internal hemorrhoid, no mass and anal tone normal. Prostate is not enlarged and not tender.  Musculoskeletal: Normal range of motion. He exhibits no edema.  Lymphadenopathy:    He has no cervical adenopathy.  Neurological: He is alert and oriented to person, place, and time. No cranial nerve deficit. Coordination normal.  Skin: Skin is warm and dry. No rash noted. He is not diaphoretic. No erythema. No pallor.  Psychiatric: He has a normal mood and affect. His behavior is normal. Judgment and thought content normal.          Assessment & Plan:

## 2012-07-16 NOTE — Assessment & Plan Note (Signed)
BP Readings from Last 3 Encounters:  07/16/12 138/106  02/09/12 132/74  01/16/12 142/80   BP has generally been more elevated on amlodipine. Will change back to Losartan, which pt tolerated well. Repeat BMP in 1 week. Pt will monitor BP at home and call if >140/90. Follow up 6 months and prn.

## 2012-07-16 NOTE — Progress Notes (Signed)
Ok

## 2012-07-16 NOTE — Assessment & Plan Note (Signed)
Reviewed recent cholesterol on labs. Discussed guidelines and new ASCVD risk calculator which shows 10 year CVD risk 2.6%, so no statin indicated. Encouraged Mediterranean style diet and exercise with goal of 3x per week.

## 2012-07-16 NOTE — Assessment & Plan Note (Signed)
Wt Readings from Last 3 Encounters:  07/16/12 276 lb (125.193 kg)  02/09/12 269 lb 8 oz (122.244 kg)  01/16/12 270 lb (122.471 kg)   Encouraged effort at weight loss with Mediterranean style diet and goal exercise 3 x per week.

## 2012-07-16 NOTE — Assessment & Plan Note (Signed)
Urinary frequency noted at night. DRE normal with no abnormalities of prostate noted. Urinalysis with trace blood. Will send urine culture, cytology. Will also check PSA with labs next week. If symptoms persistent, then we discussed referral to urology.

## 2012-07-17 LAB — URINE CULTURE
Colony Count: NO GROWTH
Organism ID, Bacteria: NO GROWTH

## 2012-07-18 ENCOUNTER — Encounter: Payer: Self-pay | Admitting: Internal Medicine

## 2012-07-19 ENCOUNTER — Other Ambulatory Visit (INDEPENDENT_AMBULATORY_CARE_PROVIDER_SITE_OTHER): Payer: BC Managed Care – PPO

## 2012-07-19 DIAGNOSIS — R319 Hematuria, unspecified: Secondary | ICD-10-CM

## 2012-07-19 LAB — POCT URINALYSIS DIPSTICK
Bilirubin, UA: NEGATIVE
Blood, UA: NEGATIVE
Glucose, UA: NEGATIVE
Ketones, UA: NEGATIVE
Leukocytes, UA: NEGATIVE
Nitrite, UA: NEGATIVE
Protein, UA: NEGATIVE
Spec Grav, UA: 1.025
Urobilinogen, UA: 0.2
pH, UA: 6

## 2012-07-26 ENCOUNTER — Other Ambulatory Visit (INDEPENDENT_AMBULATORY_CARE_PROVIDER_SITE_OTHER): Payer: BC Managed Care – PPO

## 2012-07-26 DIAGNOSIS — I10 Essential (primary) hypertension: Secondary | ICD-10-CM

## 2012-07-26 DIAGNOSIS — R350 Frequency of micturition: Secondary | ICD-10-CM

## 2012-07-26 LAB — BASIC METABOLIC PANEL
BUN: 17 mg/dL (ref 6–23)
CO2: 27 mEq/L (ref 19–32)
Calcium: 9.1 mg/dL (ref 8.4–10.5)
Chloride: 103 mEq/L (ref 96–112)
Creatinine, Ser: 1.2 mg/dL (ref 0.4–1.5)
GFR: 72.01 mL/min (ref >60.00)
Glucose, Bld: 146 mg/dL — ABNORMAL HIGH (ref 70–99)
Potassium: 4.2 mEq/L (ref 3.5–5.1)
Sodium: 137 mEq/L (ref 135–145)

## 2012-07-27 LAB — PSA, TOTAL AND FREE
PSA, Free Pct: 45 % (ref >25)
PSA, Free: 0.18 ng/mL
PSA: 0.4 ng/mL (ref <=4.00)

## 2012-08-18 ENCOUNTER — Encounter: Payer: Self-pay | Admitting: Internal Medicine

## 2013-01-15 ENCOUNTER — Ambulatory Visit (INDEPENDENT_AMBULATORY_CARE_PROVIDER_SITE_OTHER): Payer: BC Managed Care – PPO | Admitting: Internal Medicine

## 2013-01-15 ENCOUNTER — Encounter: Payer: Self-pay | Admitting: Internal Medicine

## 2013-01-15 VITALS — BP 132/92 | HR 77 | Temp 99.3°F | Wt 280.0 lb

## 2013-01-15 DIAGNOSIS — M722 Plantar fascial fibromatosis: Secondary | ICD-10-CM

## 2013-01-15 DIAGNOSIS — I10 Essential (primary) hypertension: Secondary | ICD-10-CM

## 2013-01-15 DIAGNOSIS — E669 Obesity, unspecified: Secondary | ICD-10-CM

## 2013-01-15 LAB — COMPREHENSIVE METABOLIC PANEL
ALT: 48 U/L (ref 0–53)
AST: 31 U/L (ref 0–37)
Albumin: 4.4 g/dL (ref 3.5–5.2)
Alkaline Phosphatase: 53 U/L (ref 39–117)
BUN: 15 mg/dL (ref 6–23)
CO2: 29 mEq/L (ref 19–32)
Calcium: 9.6 mg/dL (ref 8.4–10.5)
Chloride: 103 mEq/L (ref 96–112)
Creatinine, Ser: 1 mg/dL (ref 0.4–1.5)
GFR: 85.97 mL/min (ref >60.00)
Glucose, Bld: 101 mg/dL — ABNORMAL HIGH (ref 70–99)
Potassium: 4.3 mEq/L (ref 3.5–5.1)
Sodium: 140 mEq/L (ref 135–145)
Total Bilirubin: 1 mg/dL (ref 0.3–1.2)
Total Protein: 7.3 g/dL (ref 6.0–8.3)

## 2013-01-15 LAB — MICROALBUMIN / CREATININE URINE RATIO
Creatinine,U: 138.4 mg/dL
Microalb Creat Ratio: 0.5 mg/g (ref 0.0–30.0)
Microalb, Ur: 0.7 mg/dL (ref 0.0–1.9)

## 2013-01-15 MED ORDER — HYDROCHLOROTHIAZIDE 12.5 MG PO CAPS: 12.5000 mg | ORAL_CAPSULE | Freq: Every day | ORAL | Status: DC

## 2013-01-15 NOTE — Assessment & Plan Note (Signed)
Wt Readings from Last 3 Encounters:  01/15/13 280 lb (127.007 kg)  07/16/12 276 lb (125.193 kg)  02/09/12 269 lb 8 oz (122.244 kg)   Body mass index is 35 kg/(m^2).  Encouraged healthy diet low in saturated fat in processed carbohydrates. Encouraged regular physical activity such as biking or elliptical which would put less pressure on his heel.

## 2013-01-15 NOTE — Progress Notes (Signed)
Subjective:    Patient ID: Todd Willis, male    DOB: [birth year], 42 y.o.   MRN: [id number]  HPI 42 year old male with history of hypertension and anxiety presents for followup. He reports it has been a very busy time for him with both work and home responsibilities. He has also had issues with pain in his left heel which he diagnosed as plantar fasciitis. The pain is described as sharp stabbing pain which is made worse with initial physical activity. It has improved somewhat with rest and icing. He has not been exercising. He has gained some weight. He has been compliant with his medication. He has not been checking his blood pressure. He denies any chest pain, palpitations, headache. He does note increased fluid retention in his hands and feet particularly in the afternoons.  Outpatient Prescriptions Prior to Visit  Medication Sig Dispense Refill  . losartan (COZAAR) 50 MG tablet Take 1 tablet (50 mg total) by mouth daily.  90 tablet  4  . Multiple Vitamin (MULTIVITAMIN) tablet Take 1 tablet by mouth daily.      Marland Kitchen ALPRAZolam (XANAX) 0.25 MG tablet       . aspirin EC 81 MG tablet Take 1 tablet (81 mg total) by mouth daily.  30 tablet  11  . omeprazole-sodium bicarbonate (ZEGERID) 40-1100 MG per capsule Take 1 capsule by mouth daily before breakfast.  30 capsule  11   No facility-administered medications prior to visit.   BP 132/92  Pulse 77  Temp(Src) 99.3 F (37.4 C) (Oral)  Wt 280 lb (127.007 kg)  BMI 35 kg/m2  SpO2 97%  Review of Systems  Constitutional: Positive for fatigue. Negative for fever, chills, activity change, appetite change and unexpected weight change.  Eyes: Negative for visual disturbance.  Respiratory: Negative for cough and shortness of breath.   Cardiovascular: Negative for chest pain, palpitations and leg swelling.  Gastrointestinal: Negative for abdominal pain and abdominal distention.  Genitourinary: Negative for dysuria, urgency and difficulty urinating.   Musculoskeletal: Positive for arthralgias. Negative for gait problem.  Skin: Negative for color change and rash.  Hematological: Negative for adenopathy.  Psychiatric/Behavioral: Negative for sleep disturbance and dysphoric mood. The patient is not nervous/anxious.        Objective:   Physical Exam  Constitutional: He is oriented to person, place, and time. He appears well-developed and well-nourished. No distress.  HENT:  Head: Normocephalic and atraumatic.  Right Ear: External ear normal.  Left Ear: External ear normal.  Nose: Nose normal.  Mouth/Throat: Oropharynx is clear and moist. No oropharyngeal exudate.  Eyes: Conjunctivae and EOM are normal. Pupils are equal, round, and reactive to light. Right eye exhibits no discharge. Left eye exhibits no discharge. No scleral icterus.  Neck: Normal range of motion. Neck supple. No tracheal deviation present. No thyromegaly present.  Cardiovascular: Normal rate, regular rhythm and normal heart sounds.  Exam reveals no gallop and no friction rub.   No murmur heard. Pulmonary/Chest: Effort normal and breath sounds normal. No respiratory distress. He has no wheezes. He has no rales. He exhibits no tenderness.  Musculoskeletal: Normal range of motion. He exhibits no edema.  Lymphadenopathy:    He has no cervical adenopathy.  Neurological: He is alert and oriented to person, place, and time. No cranial nerve deficit. Coordination normal.  Skin: Skin is warm and dry. No rash noted. He is not diaphoretic. No erythema. No pallor.  Psychiatric: He has a normal mood and affect. His behavior is normal. Judgment and  thought content normal.          Assessment & Plan:

## 2013-01-15 NOTE — Assessment & Plan Note (Signed)
Symptoms are consistent with plantar fasciitis. Discussed potential referral to podiatry for evaluation if symptoms persist. Encouraged him to continue icing his foot. Encouraged him to use nonsteroidals as needed. He will call if symptoms are persistent.

## 2013-01-15 NOTE — Assessment & Plan Note (Signed)
BP Readings from Last 3 Encounters:  01/15/13 132/92  07/16/12 138/106  02/09/12 132/74   Blood pressure elevated today. Recommended increasing losartan to 100 mg daily. Patient would prefer to add back hydrochlorothiazide as this worked well for him in the past. Will give trial of this. Will start hydrochlorothiazide 12.5 mg every morning. Will continue losartan 50 mg daily. Renal function stable on labs today. Followup in one month for blood pressure check.

## 2013-01-23 ENCOUNTER — Other Ambulatory Visit: Payer: Self-pay

## 2013-02-08 ENCOUNTER — Encounter: Payer: Self-pay | Admitting: Internal Medicine

## 2013-04-08 ENCOUNTER — Ambulatory Visit: Payer: BC Managed Care – PPO | Admitting: Internal Medicine

## 2013-04-28 ENCOUNTER — Ambulatory Visit: Payer: BC Managed Care – PPO | Admitting: Internal Medicine

## 2013-05-22 ENCOUNTER — Encounter: Payer: Self-pay | Admitting: Internal Medicine

## 2013-05-22 ENCOUNTER — Ambulatory Visit (INDEPENDENT_AMBULATORY_CARE_PROVIDER_SITE_OTHER): Payer: BC Managed Care – PPO | Admitting: Internal Medicine

## 2013-05-22 VITALS — BP 118/90 | HR 94 | Temp 98.3°F | Wt 291.0 lb

## 2013-05-22 DIAGNOSIS — M722 Plantar fascial fibromatosis: Secondary | ICD-10-CM

## 2013-05-22 DIAGNOSIS — R400 Somnolence: Secondary | ICD-10-CM

## 2013-05-22 DIAGNOSIS — G4710 Hypersomnia, unspecified: Secondary | ICD-10-CM

## 2013-05-22 DIAGNOSIS — F338 Other recurrent depressive disorders: Secondary | ICD-10-CM

## 2013-05-22 DIAGNOSIS — I10 Essential (primary) hypertension: Secondary | ICD-10-CM

## 2013-05-22 DIAGNOSIS — F39 Unspecified mood [affective] disorder: Secondary | ICD-10-CM

## 2013-05-22 NOTE — Assessment & Plan Note (Signed)
  BP Readings from Last 3 Encounters:  05/22/13 118/90  01/15/13 132/92  07/16/12 138/106   BP improved, however DBP slightly elevated. Discussed increasing Losartan to 100mg  daily if BP continues to be elevated. Will monitor for now.

## 2013-05-22 NOTE — Progress Notes (Signed)
Pre visit review using our clinic review tool, if applicable. No additional management support is needed unless otherwise documented below in the visit note. 

## 2013-05-22 NOTE — Assessment & Plan Note (Signed)
Symptoms are most consistent with OSA. Encouraged him to consider sleep study given risks including obesity. He declines for now. Discussed potential benefits of better control of BP. He will call or email if he would like to proceed with sleep study.

## 2013-05-22 NOTE — Assessment & Plan Note (Signed)
Symptoms improved with avoiding weight bearing exercise. Will continue to monitor.

## 2013-05-22 NOTE — Assessment & Plan Note (Signed)
Symptoms consistent with SAD. Discussed adding SSRI, however he would like to hold off for now. He will call if he wants to consider medication.

## 2013-05-22 NOTE — Progress Notes (Signed)
Subjective:    Patient ID: Todd Willis, male    DOB: 08-21-1970, 43 y.o.   MRN: [id number]  HPI 43YO male with hypertension, obesity presents for follow up. Notes some increased symptoms of depressed mood and irritability during the winter months. Questions if he may have SAD. He is not currently taking any medication for this. Feels that symptoms improving over last few weeks.  Compliant with BP meds. Has not been checking BP. No chest pain, palpitations, headache. Notes some fatigue in the mornings. Has trouble getting "going."  Wife notes that he snores. He has never had a sleep study.  Review of Systems  Constitutional: Positive for fatigue. Negative for fever, chills, activity change, appetite change and unexpected weight change.  Eyes: Negative for visual disturbance.  Respiratory: Negative for cough and shortness of breath.   Cardiovascular: Negative for chest pain, palpitations and leg swelling.  Gastrointestinal: Negative for abdominal pain and abdominal distention.  Genitourinary: Negative for dysuria, urgency and difficulty urinating.  Musculoskeletal: Negative for arthralgias and gait problem.  Skin: Negative for color change and rash.  Hematological: Negative for adenopathy.  Psychiatric/Behavioral: Positive for sleep disturbance and dysphoric mood. The patient is not nervous/anxious.        Objective:    BP 118/90  Pulse 94  Temp(Src) 98.3 F (36.8 C) (Oral)  Wt 291 lb (131.997 kg)  SpO2 97% Physical Exam  Constitutional: He is oriented to person, place, and time. He appears well-developed and well-nourished. No distress.  HENT:  Head: Normocephalic and atraumatic.  Right Ear: External ear normal.  Left Ear: External ear normal.  Nose: Nose normal.  Mouth/Throat: Oropharynx is clear and moist. No oropharyngeal exudate.  Eyes: Conjunctivae and EOM are normal. Pupils are equal, round, and reactive to light. Right eye exhibits no discharge. Left eye exhibits no  discharge. No scleral icterus.  Neck: Normal range of motion. Neck supple. No tracheal deviation present. No thyromegaly present.  Cardiovascular: Normal rate, regular rhythm and normal heart sounds.  Exam reveals no gallop and no friction rub.   No murmur heard. Pulmonary/Chest: Effort normal and breath sounds normal. No accessory muscle usage. Not tachypneic. No respiratory distress. He has no decreased breath sounds. He has no wheezes. He has no rhonchi. He has no rales. He exhibits no tenderness.  Musculoskeletal: Normal range of motion. He exhibits no edema.  Lymphadenopathy:    He has no cervical adenopathy.  Neurological: He is alert and oriented to person, place, and time. No cranial nerve deficit. Coordination normal.  Skin: Skin is warm and dry. No rash noted. He is not diaphoretic. No erythema. No pallor.  Psychiatric: He has a normal mood and affect. His speech is normal and behavior is normal. Judgment and thought content normal.          Assessment & Plan:   Problem List Items Addressed This Visit   Daytime somnolence     Symptoms are most consistent with OSA. Encouraged him to consider sleep study given risks including obesity. He declines for now. Discussed potential benefits of better control of BP. He will call or email if he would like to proceed with sleep study.    Relevant Orders      TSH      B12      Vit D  25 hydroxy (rtn osteoporosis monitoring)   Hypertension - Primary       BP Readings from Last 3 Encounters:  05/22/13 118/90  01/15/13 132/92  07/16/12 138/106  BP improved, however DBP slightly elevated. Discussed increasing Losartan to 186m daily if BP continues to be elevated. Will monitor for now.    Relevant Orders      Comp Met (CMET)   Plantar fasciitis of left foot     Symptoms improved with avoiding weight bearing exercise. Will continue to monitor.    Seasonal affective disorder     Symptoms consistent with SAD. Discussed adding SSRI,  however he would like to hold off for now. He will call if he wants to consider medication.        Return in about 4 months (around 09/21/2013) for Physical.

## 2013-05-23 ENCOUNTER — Telehealth: Payer: Self-pay | Admitting: Internal Medicine

## 2013-05-23 LAB — COMPREHENSIVE METABOLIC PANEL
ALT: 46 U/L (ref 0–53)
AST: 27 U/L (ref 0–37)
Albumin: 4.4 g/dL (ref 3.5–5.2)
Alkaline Phosphatase: 51 U/L (ref 39–117)
BUN: 16 mg/dL (ref 6–23)
CO2: 31 mEq/L (ref 19–32)
Calcium: 9.8 mg/dL (ref 8.4–10.5)
Chloride: 101 mEq/L (ref 96–112)
Creatinine, Ser: 1.1 mg/dL (ref 0.4–1.5)
GFR: 76.97 mL/min (ref >60.00)
Glucose, Bld: 88 mg/dL (ref 70–99)
Potassium: 4.1 mEq/L (ref 3.5–5.1)
Sodium: 138 mEq/L (ref 135–145)
Total Bilirubin: 0.9 mg/dL (ref 0.3–1.2)
Total Protein: 7.2 g/dL (ref 6.0–8.3)

## 2013-05-23 LAB — VITAMIN D 25 HYDROXY (VIT D DEFICIENCY, FRACTURES): Vit D, 25-Hydroxy: 31 ng/mL (ref 30–89)

## 2013-05-23 LAB — TSH: TSH: 1.74 u[IU]/mL (ref 0.35–5.50)

## 2013-05-23 LAB — VITAMIN B12: Vitamin B-12: 441 pg/mL (ref 211–911)

## 2013-05-23 NOTE — Telephone Encounter (Signed)
Relevant patient education assigned to patient using Emmi. ° °

## 2013-08-08 ENCOUNTER — Encounter: Payer: Self-pay | Admitting: Internal Medicine

## 2013-08-13 ENCOUNTER — Other Ambulatory Visit: Payer: Self-pay | Admitting: Internal Medicine

## 2013-09-29 ENCOUNTER — Encounter: Payer: Self-pay | Admitting: Internal Medicine

## 2013-09-29 ENCOUNTER — Ambulatory Visit (INDEPENDENT_AMBULATORY_CARE_PROVIDER_SITE_OTHER): Payer: BC Managed Care – PPO | Admitting: Internal Medicine

## 2013-09-29 VITALS — BP 120/90 | HR 95 | Temp 99.1°F | Ht 75.5 in | Wt 286.0 lb

## 2013-09-29 DIAGNOSIS — J0100 Acute maxillary sinusitis, unspecified: Secondary | ICD-10-CM

## 2013-09-29 DIAGNOSIS — I10 Essential (primary) hypertension: Secondary | ICD-10-CM

## 2013-09-29 DIAGNOSIS — Z0000 Encounter for general adult medical examination without abnormal findings: Secondary | ICD-10-CM

## 2013-09-29 DIAGNOSIS — E669 Obesity, unspecified: Secondary | ICD-10-CM

## 2013-09-29 LAB — COMPREHENSIVE METABOLIC PANEL
ALT: 55 U/L — ABNORMAL HIGH (ref 0–53)
AST: 35 U/L (ref 0–37)
Albumin: 4.2 g/dL (ref 3.5–5.2)
Alkaline Phosphatase: 47 U/L (ref 39–117)
BUN: 21 mg/dL (ref 6–23)
CO2: 30 mEq/L (ref 19–32)
Calcium: 9.1 mg/dL (ref 8.4–10.5)
Chloride: 106 mEq/L (ref 96–112)
Creatinine, Ser: 1 mg/dL (ref 0.4–1.5)
GFR: 87.68 mL/min (ref >60.00)
Glucose, Bld: 101 mg/dL — ABNORMAL HIGH (ref 70–99)
Potassium: 4.1 mEq/L (ref 3.5–5.1)
Sodium: 140 mEq/L (ref 135–145)
Total Bilirubin: 1 mg/dL (ref 0.2–1.2)
Total Protein: 6.8 g/dL (ref 6.0–8.3)

## 2013-09-29 LAB — CBC WITH DIFFERENTIAL/PLATELET
Basophils Absolute: 0.1 10*3/uL (ref 0.0–0.1)
Basophils Relative: 2.8 % (ref 0.0–3.0)
Eosinophils Absolute: 0.2 10*3/uL (ref 0.0–0.7)
Eosinophils Relative: 3.7 % (ref 0.0–5.0)
HCT: 43.7 % (ref 39.0–52.0)
Hemoglobin: 14.7 g/dL (ref 13.0–17.0)
Lymphocytes Relative: 24.1 % (ref 12.0–46.0)
Lymphs Abs: 1.3 10*3/uL (ref 0.7–4.0)
MCHC: 33.6 g/dL (ref 30.0–36.0)
MCV: 86 fl (ref 78.0–100.0)
Monocytes Absolute: 0.4 10*3/uL (ref 0.1–1.0)
Monocytes Relative: 7 % (ref 3.0–12.0)
Neutro Abs: 3.3 10*3/uL (ref 1.4–7.7)
Neutrophils Relative %: 62.4 % (ref 43.0–77.0)
Platelets: 210 10*3/uL (ref 150.0–400.0)
RBC: 5.07 Mil/uL (ref 4.22–5.81)
RDW: 13.4 % (ref 11.5–15.5)
WBC: 5.2 10*3/uL (ref 4.0–10.5)

## 2013-09-29 LAB — LIPID PANEL
Cholesterol: 204 mg/dL — ABNORMAL HIGH (ref 0–200)
HDL: 33.1 mg/dL — ABNORMAL LOW (ref >39.00)
LDL Cholesterol: 144 mg/dL — ABNORMAL HIGH (ref 0–99)
NonHDL: 170.9
Total CHOL/HDL Ratio: 6
Triglycerides: 136 mg/dL (ref 0.0–149.0)
VLDL: 27.2 mg/dL (ref 0.0–40.0)

## 2013-09-29 LAB — MICROALBUMIN / CREATININE URINE RATIO
Creatinine,U: 169 mg/dL
Microalb Creat Ratio: 1.4 mg/g (ref 0.0–30.0)
Microalb, Ur: 2.3 mg/dL — ABNORMAL HIGH (ref 0.0–1.9)

## 2013-09-29 LAB — HEMOGLOBIN A1C: Hgb A1c MFr Bld: 5.6 % (ref 4.6–6.5)

## 2013-09-29 MED ORDER — ALPRAZOLAM 0.25 MG PO TABS: 0.2500 mg | ORAL_TABLET | Freq: Three times a day (TID) | ORAL | Status: DC | PRN

## 2013-09-29 MED ORDER — AMOXICILLIN-POT CLAVULANATE 875-125 MG PO TABS: 1.0000 | ORAL_TABLET | Freq: Two times a day (BID) | ORAL | Status: DC

## 2013-09-29 MED ORDER — HYDROCOD POLST-CHLORPHEN POLST 10-8 MG/5ML PO LQCR: 5.0000 mL | Freq: Two times a day (BID) | ORAL | Status: DC | PRN

## 2013-09-29 NOTE — Assessment & Plan Note (Signed)
Symptoms and exam c/w maxillary sinusitis. Will start Augmentin. Continue OTC anthistamines prn. Ibuprofen prn pain or fever. Tussionex for cough. Call if symptoms are not improving over next 72hr.

## 2013-09-29 NOTE — Progress Notes (Signed)
Pre visit review using our clinic review tool, if applicable. No additional management support is needed unless otherwise documented below in the visit note. 

## 2013-09-29 NOTE — Assessment & Plan Note (Signed)
BP Readings from Last 3 Encounters:  09/29/13 120/90  05/22/13 118/90  01/15/13 132/92   BP well controlled generally on current medications. Will check renal function with labs today.

## 2013-09-29 NOTE — Patient Instructions (Addendum)
It was nice to see you today.  Please start Augmentin twice daily for sinus infection. Use Tussionex as needed for cough.  Continue efforts at healthy diet and set a goal of exercising 40 minutes three times per week.

## 2013-09-29 NOTE — Assessment & Plan Note (Signed)
Wt Readings from Last 3 Encounters:  09/29/13 286 lb (129.729 kg)  05/22/13 291 lb (131.997 kg)  01/15/13 280 lb (127.007 kg)  Body mass index is 35.26 kg/(m^2). Encouraged healthy, Mediterranean Style diet and exercise with goal of 40min 3x per week. Will check TSH and A1c with labs.

## 2013-09-29 NOTE — Assessment & Plan Note (Signed)
General medical exam normal today except as noted. Encouraged healthy diet and regular exercise. Immunizations are UTD. Will check labs including CBC, CMP, lipids, A1c and TSH. Follow up in 6 months and prn.

## 2013-09-29 NOTE — Progress Notes (Signed)
Subjective:    Patient ID: Todd Willis, male    DOB: 09/20/1970, 43 y.o.   MRN: [id number]  HPI 43YO male presents for annual exam.  Concerned about recent nasal congestion and occasional dry cough. No fever, dyspnea. Not taking anything for this. Ongoing for weeks.  HTN - BP generally 120-130s/80-90s. Compliant with medication. No chest pain, dyspnea.   Trying to exercise 40min 3x per week. Trying to follow healthy diet. Would like to get back to 240lb.   Review of Systems  Constitutional: Negative for fever, chills, activity change, appetite change, fatigue and unexpected weight change.  HENT: Positive for congestion, postnasal drip, rhinorrhea, sinus pressure and sore throat. Negative for ear pain and trouble swallowing.   Eyes: Negative for visual disturbance.  Respiratory: Positive for cough. Negative for shortness of breath.   Cardiovascular: Negative for chest pain, palpitations and leg swelling.  Gastrointestinal: Negative for nausea, vomiting, abdominal pain, diarrhea, constipation and abdominal distention.  Genitourinary: Negative for dysuria, urgency and difficulty urinating.  Musculoskeletal: Negative for arthralgias and gait problem.  Skin: Negative for color change and rash.  Hematological: Negative for adenopathy.  Psychiatric/Behavioral: Negative for sleep disturbance and dysphoric mood. The patient is not nervous/anxious.        Objective:    BP 120/90  Pulse 95  Temp(Src) 99.1 F (37.3 C) (Oral)  Ht 6' 3.5" (1.918 m)  Wt 286 lb (129.729 kg)  BMI 35.26 kg/m2  SpO2 98% Physical Exam  Constitutional: He is oriented to person, place, and time. He appears well-developed and well-nourished. No distress.  HENT:  Head: Normocephalic and atraumatic.  Right Ear: External ear normal.  Left Ear: External ear normal.  Nose: Mucosal edema present. Right sinus exhibits maxillary sinus tenderness. Left sinus exhibits maxillary sinus tenderness.  Mouth/Throat:  Oropharynx is clear and moist. No oropharyngeal exudate.  Eyes: Conjunctivae and EOM are normal. Pupils are equal, round, and reactive to light. Right eye exhibits no discharge. Left eye exhibits no discharge. No scleral icterus.  Neck: Normal range of motion. Neck supple. No tracheal deviation present. No thyromegaly present.  Cardiovascular: Normal rate, regular rhythm and normal heart sounds.  Exam reveals no gallop and no friction rub.   No murmur heard. Pulmonary/Chest: Effort normal. No accessory muscle usage. Not tachypneic. No respiratory distress. He has no decreased breath sounds. He has no wheezes. He has rhonchi (few scattered). He has no rales. He exhibits no tenderness.  Abdominal: Soft. Bowel sounds are normal. He exhibits no distension and no mass. There is no tenderness. There is no rebound and no guarding.  Musculoskeletal: Normal range of motion. He exhibits no edema.  Lymphadenopathy:    He has no cervical adenopathy.  Neurological: He is alert and oriented to person, place, and time. No cranial nerve deficit. Coordination normal.  Skin: Skin is warm and dry. No rash noted. He is not diaphoretic. No erythema. No pallor.  Psychiatric: He has a normal mood and affect. His behavior is normal. Judgment and thought content normal.          Assessment & Plan:   Problem List Items Addressed This Visit     Unprioritized   Hypertension      BP Readings from Last 3 Encounters:  09/29/13 120/90  05/22/13 118/90  01/15/13 132/92   BP well controlled generally on current medications. Will check renal function with labs today.     Obesity (BMI 30-39.9)      Wt Readings from Last 3 Encounters:  09/29/13 286 lb (129.729 kg)  05/22/13 291 lb (131.997 kg)  01/15/13 280 lb (127.007 kg)  Body mass index is 35.26 kg/(m^2). Encouraged healthy, Mediterranean Style diet and exercise with goal of 3x per week. Will check TSH and A1c with labs.    Relevant Orders      TSH       Hemoglobin A1c   Routine general medical examination at a health care facility - Primary     General medical exam normal today except as noted. Encouraged healthy diet and regular exercise. Immunizations are UTD. Will check labs including CBC, CMP, lipids, A1c and TSH. Follow up in 6 months and prn.    Relevant Orders      CBC with Differential      Comprehensive metabolic panel      Lipid panel      Microalbumin / creatinine urine ratio      Vit D  25 hydroxy (rtn osteoporosis monitoring)      PSA, total and free      EKG 12-Lead    Other Visit Diagnoses   Acute maxillary sinusitis, recurrence not specified        Relevant Medications       AMOXICILLIN-POT CLAVULANATE 875-125 MG PO TABS       chlorpheniramine-HYDROcodone (TUSSIONEX) suspension 8-10 mg/48mL        Return in about 6 months (around 04/01/2014) for Recheck of Blood Pressure.

## 2013-09-30 LAB — PSA, TOTAL AND FREE
PSA, Free Pct: 39 % (ref >25)
PSA, Free: 0.14 ng/mL
PSA: 0.36 ng/mL (ref <=4.00)

## 2013-09-30 LAB — VITAMIN D 25 HYDROXY (VIT D DEFICIENCY, FRACTURES): VITD: 19.78 ng/mL

## 2013-09-30 LAB — TSH: TSH: 1.9 u[IU]/mL (ref 0.35–4.50)

## 2013-10-27 ENCOUNTER — Other Ambulatory Visit: Payer: Self-pay | Admitting: Internal Medicine

## 2014-02-12 ENCOUNTER — Other Ambulatory Visit: Payer: Self-pay | Admitting: Internal Medicine

## 2014-04-08 ENCOUNTER — Ambulatory Visit: Payer: BC Managed Care – PPO | Admitting: Internal Medicine

## 2014-04-14 ENCOUNTER — Ambulatory Visit (INDEPENDENT_AMBULATORY_CARE_PROVIDER_SITE_OTHER): Payer: BLUE CROSS/BLUE SHIELD | Admitting: Internal Medicine

## 2014-04-14 ENCOUNTER — Encounter: Payer: Self-pay | Admitting: Internal Medicine

## 2014-04-14 VITALS — BP 135/86 | HR 101 | Temp 98.3°F | Ht 75.5 in | Wt 286.5 lb

## 2014-04-14 DIAGNOSIS — R945 Abnormal results of liver function studies: Secondary | ICD-10-CM

## 2014-04-14 DIAGNOSIS — K219 Gastro-esophageal reflux disease without esophagitis: Secondary | ICD-10-CM

## 2014-04-14 DIAGNOSIS — Z139 Encounter for screening, unspecified: Secondary | ICD-10-CM

## 2014-04-14 DIAGNOSIS — R7989 Other specified abnormal findings of blood chemistry: Secondary | ICD-10-CM | POA: Insufficient documentation

## 2014-04-14 DIAGNOSIS — E669 Obesity, unspecified: Secondary | ICD-10-CM

## 2014-04-14 DIAGNOSIS — I10 Essential (primary) hypertension: Secondary | ICD-10-CM

## 2014-04-14 LAB — COMPREHENSIVE METABOLIC PANEL
ALT: 52 U/L (ref 0–53)
AST: 32 U/L (ref 0–37)
Albumin: 4.3 g/dL (ref 3.5–5.2)
Alkaline Phosphatase: 58 U/L (ref 39–117)
BUN: 16 mg/dL (ref 6–23)
CO2: 31 mEq/L (ref 19–32)
Calcium: 9.8 mg/dL (ref 8.4–10.5)
Chloride: 101 mEq/L (ref 96–112)
Creatinine, Ser: 0.94 mg/dL (ref 0.40–1.50)
GFR: 92.85 mL/min (ref >60.00)
Glucose, Bld: 101 mg/dL — ABNORMAL HIGH (ref 70–99)
Potassium: 4.3 mEq/L (ref 3.5–5.1)
Sodium: 139 mEq/L (ref 135–145)
Total Bilirubin: 0.9 mg/dL (ref 0.2–1.2)
Total Protein: 7 g/dL (ref 6.0–8.3)

## 2014-04-14 MED ORDER — LOSARTAN POTASSIUM-HCTZ 50-12.5 MG PO TABS: 1.0000 | ORAL_TABLET | Freq: Every day | ORAL | Status: DC

## 2014-04-14 NOTE — Assessment & Plan Note (Signed)
Symptoms well controlled with use of Zegrid. Will continue.

## 2014-04-14 NOTE — Progress Notes (Signed)
Subjective:    Patient ID: Todd Willis, male    DOB: 1970-06-03, 44 y.o.   MRN: [id number]  HPI 44YO male presents for follow up.  Feels some nasal congestion. No fever, cough. Not taking anything for this.  HTN- BP at home typically 120-130 systolic. No HA, CP. Compliant with medication.  Symptoms of GERD improved with use of Zegrid. No current abdominal pain, nausea.  Wt Readings from Last 3 Encounters:  04/14/14 286 lb 8 oz (129.956 kg)  09/29/13 286 lb (129.729 kg)  05/22/13 291 lb (131.997 kg)   Body mass index is 35.33 kg/(m^2).  BP Readings from Last 3 Encounters:  04/14/14 135/86  09/29/13 120/90  05/22/13 118/90     Past medical, surgical, family and social history per today's encounter.  Review of Systems  Constitutional: Negative for fever, chills, activity change, appetite change, fatigue and unexpected weight change.  HENT: Positive for congestion, postnasal drip and rhinorrhea. Negative for sinus pressure, sore throat, trouble swallowing and voice change.   Eyes: Negative for visual disturbance.  Respiratory: Negative for cough, shortness of breath and wheezing.   Cardiovascular: Negative for chest pain, palpitations and leg swelling.  Gastrointestinal: Negative for abdominal pain, diarrhea, constipation and abdominal distention.  Genitourinary: Negative for dysuria, urgency and difficulty urinating.  Musculoskeletal: Negative for arthralgias and gait problem.  Skin: Negative for color change and rash.  Hematological: Negative for adenopathy.  Psychiatric/Behavioral: Negative for sleep disturbance and dysphoric mood. The patient is not nervous/anxious.        Objective:    BP 135/86 mmHg  Pulse 101  Temp(Src) 98.3 F (36.8 C) (Oral)  Ht 6' 3.5" (1.918 m)  Wt 286 lb 8 oz (129.956 kg)  BMI 35.33 kg/m2  SpO2 98% Physical Exam  Constitutional: He is oriented to person, place, and time. He appears well-developed and well-nourished. No distress.    HENT:  Head: Normocephalic and atraumatic.  Right Ear: External ear normal.  Left Ear: External ear normal.  Nose: Nose normal.  Mouth/Throat: Oropharynx is clear and moist. No oropharyngeal exudate.  Eyes: Conjunctivae and EOM are normal. Pupils are equal, round, and reactive to light. Right eye exhibits no discharge. Left eye exhibits no discharge. No scleral icterus.  Neck: Normal range of motion. Neck supple. No tracheal deviation present. No thyromegaly present.  Cardiovascular: Normal rate, regular rhythm and normal heart sounds.  Exam reveals no gallop and no friction rub.   No murmur heard. Pulmonary/Chest: Effort normal and breath sounds normal. No accessory muscle usage. No tachypnea. No respiratory distress. He has no decreased breath sounds. He has no wheezes. He has no rhonchi. He has no rales. He exhibits no tenderness.  Musculoskeletal: Normal range of motion. He exhibits no edema.  Lymphadenopathy:    He has no cervical adenopathy.  Neurological: He is alert and oriented to person, place, and time. No cranial nerve deficit. Coordination normal.  Skin: Skin is warm and dry. No rash noted. He is not diaphoretic. No erythema. No pallor.  Psychiatric: He has a normal mood and affect. His behavior is normal. Judgment and thought content normal.          Assessment & Plan:   Problem List Items Addressed This Visit      Unprioritized   Elevated LFTs    Recent elevated LFT with ALT 55. Will repeat LFTs with labs today.      GERD (gastroesophageal reflux disease)    Symptoms well controlled with use of Zegrid. Will  continue.      Hypertension - Primary    BP Readings from Last 3 Encounters:  04/14/14 135/86  09/29/13 120/90  05/22/13 118/90   BP generally has been well controlled. Will consolidate Losartan and HCTZ to one tablet daily. Renal function with labs today.      Relevant Medications   LOSARTAN POTASSIUM-HCTZ 50-12.5 MG PO TABS   Other Relevant Orders    Comprehensive metabolic panel   Obesity (BMI 16-1030-39.9)    Wt Readings from Last 3 Encounters:  04/14/14 286 lb 8 oz (129.956 kg)  09/29/13 286 lb (129.729 kg)  05/22/13 291 lb (131.997 kg)   Body mass index is 35.33 kg/(m^2). Encouraged healthy diet and exercise.       Other Visit Diagnoses    Screening        Relevant Orders    Varicella zoster antibody, IgG        Return in about 6 months (around 10/13/2014) for Physical.

## 2014-04-14 NOTE — Assessment & Plan Note (Signed)
Wt Readings from Last 3 Encounters:  04/14/14 286 lb 8 oz (129.956 kg)  09/29/13 286 lb (129.729 kg)  05/22/13 291 lb (131.997 kg)   Body mass index is 35.33 kg/(m^2). Encouraged healthy diet and exercise.

## 2014-04-14 NOTE — Progress Notes (Signed)
Pre visit review using our clinic review tool, if applicable. No additional management support is needed unless otherwise documented below in the visit note. 

## 2014-04-14 NOTE — Patient Instructions (Signed)
Labs today.  Follow up in 6 months for physical exam. 

## 2014-04-14 NOTE — Assessment & Plan Note (Signed)
Recent elevated LFT with ALT 55. Will repeat LFTs with labs today.

## 2014-04-14 NOTE — Assessment & Plan Note (Signed)
BP Readings from Last 3 Encounters:  04/14/14 135/86  09/29/13 120/90  05/22/13 118/90   BP generally has been well controlled. Will consolidate Losartan and HCTZ to one tablet daily. Renal function with labs today.

## 2014-04-15 ENCOUNTER — Encounter: Payer: Self-pay | Admitting: Internal Medicine

## 2014-04-15 LAB — VARICELLA ZOSTER ANTIBODY, IGG: Varicella IgG: 3106 Index — ABNORMAL HIGH (ref <135.00)

## 2014-06-02 ENCOUNTER — Encounter: Payer: Self-pay | Admitting: Internal Medicine

## 2014-06-02 ENCOUNTER — Ambulatory Visit (INDEPENDENT_AMBULATORY_CARE_PROVIDER_SITE_OTHER): Payer: BLUE CROSS/BLUE SHIELD | Admitting: Internal Medicine

## 2014-06-02 VITALS — BP 142/96 | HR 90 | Temp 98.3°F | Ht 75.5 in | Wt 288.1 lb

## 2014-06-02 DIAGNOSIS — K219 Gastro-esophageal reflux disease without esophagitis: Secondary | ICD-10-CM

## 2014-06-02 DIAGNOSIS — E669 Obesity, unspecified: Secondary | ICD-10-CM

## 2014-06-02 DIAGNOSIS — I10 Essential (primary) hypertension: Secondary | ICD-10-CM

## 2014-06-02 MED ORDER — PANTOPRAZOLE SODIUM 40 MG PO TBEC: 40.0000 mg | DELAYED_RELEASE_TABLET | Freq: Two times a day (BID) | ORAL | Status: DC

## 2014-06-02 NOTE — Assessment & Plan Note (Signed)
BP Readings from Last 3 Encounters:  06/02/14 142/96  04/14/14 135/86  09/29/13 120/90   BP elevated. Will continue Losartan-HCTZ and recheck 4 weeks. Encouraged healthy diet and exercise with goal of weight loss.

## 2014-06-02 NOTE — Assessment & Plan Note (Signed)
Progressive symptoms of GERD on Zegrid. Will send H. Pylori breath test. Change to Pantoprazole bid. Will set up GI evaluation for upper endoscopy. Follow up in 4 weeks and prn.

## 2014-06-02 NOTE — Patient Instructions (Signed)
H Pylori breath test today.  Stop Zegrid  Start Pantoprazole twice daily.  We will set up evaluation with GI for upper endoscopy.

## 2014-06-02 NOTE — Progress Notes (Signed)
Pre visit review using our clinic review tool, if applicable. No additional management support is needed unless otherwise documented below in the visit note. 

## 2014-06-02 NOTE — Progress Notes (Signed)
Subjective:    Patient ID: Todd Willis, male    DOB: 04/16/1970, 44 y.o.   MRN: [id number]  HPI  44YO male presents for acute visit.  Having intermittent left chest pain with belching. He attributes this to GERD. Seems to be worse with eating spicy foods, tomato. Had severe episode last week after eating [place]. Recurrent pain today after eating [place] last night. No nausea, vomiting. No change in BMs. No black or bloody stools.  HTN - Compliant with medication. Notes decreased exercise recently. Setting goal of increased physical activity and weight loss.  Past medical, surgical, family and social history per today's encounter.  Review of Systems  Constitutional: Negative for fever, chills, activity change, appetite change, fatigue and unexpected weight change.  Eyes: Negative for visual disturbance.  Respiratory: Negative for cough and shortness of breath.   Cardiovascular: Negative for chest pain, palpitations and leg swelling.  Gastrointestinal: Positive for abdominal pain. Negative for nausea, vomiting, diarrhea, constipation and abdominal distention.  Genitourinary: Negative for dysuria, urgency and difficulty urinating.  Musculoskeletal: Negative for arthralgias and gait problem.  Skin: Negative for color change and rash.  Hematological: Negative for adenopathy.  Psychiatric/Behavioral: Negative for sleep disturbance and dysphoric mood. The patient is not nervous/anxious.        Objective:    BP 142/96 mmHg  Pulse 90  Temp(Src) 98.3 F (36.8 C) (Oral)  Ht 6' 3.5" (1.918 m)  Wt 288 lb 2 oz (130.693 kg)  BMI 35.53 kg/m2  SpO2 98% Physical Exam  Constitutional: He is oriented to person, place, and time. He appears well-developed and well-nourished. No distress.  HENT:  Head: Normocephalic and atraumatic.  Right Ear: External ear normal.  Left Ear: External ear normal.  Nose: Nose normal.  Mouth/Throat: Oropharynx is clear and moist. No oropharyngeal exudate.   Eyes: Conjunctivae and EOM are normal. Pupils are equal, round, and reactive to light. Right eye exhibits no discharge. Left eye exhibits no discharge. No scleral icterus.  Neck: Normal range of motion. Neck supple. No tracheal deviation present. No thyromegaly present.  Cardiovascular: Normal rate, regular rhythm and normal heart sounds.  Exam reveals no gallop and no friction rub.   No murmur heard. Pulmonary/Chest: Effort normal and breath sounds normal. No accessory muscle usage. No tachypnea. No respiratory distress. He has no decreased breath sounds. He has no wheezes. He has no rhonchi. He has no rales. He exhibits no tenderness.  Abdominal: Soft. Bowel sounds are normal. He exhibits no distension and no mass. There is no tenderness. There is no rebound and no guarding.  Musculoskeletal: Normal range of motion. He exhibits no edema.  Lymphadenopathy:    He has no cervical adenopathy.  Neurological: He is alert and oriented to person, place, and time. No cranial nerve deficit. Coordination normal.  Skin: Skin is warm and dry. No rash noted. He is not diaphoretic. No erythema. No pallor.  Psychiatric: He has a normal mood and affect. His behavior is normal. Judgment and thought content normal.          Assessment & Plan:   Problem List Items Addressed This Visit      Unprioritized   GERD (gastroesophageal reflux disease) - Primary    Progressive symptoms of GERD on Zegrid. Will send H. Pylori breath test. Change to Pantoprazole bid. Will set up GI evaluation for upper endoscopy. Follow up in 4 weeks and prn.      Relevant Medications   pantoprazole (PROTONIX) EC tablet   Other  Relevant Orders   Ambulatory referral to Gastroenterology   H. pylori breath test   Hypertension    BP Readings from Last 3 Encounters:  06/02/14 142/96  04/14/14 135/86  09/29/13 120/90   BP elevated. Will continue Losartan-HCTZ and recheck 4 weeks. Encouraged healthy diet and exercise with goal of  weight loss.       Obesity (BMI 30-39.9)    Wt Readings from Last 3 Encounters:  06/02/14 288 lb 2 oz (130.693 kg)  04/14/14 286 lb 8 oz (129.956 kg)  09/29/13 286 lb (129.729 kg)   Body mass index is 35.53 kg/(m^2). Encouraged healthy diet and exercise with goal of weight loss.          Return in about 4 weeks (around 06/30/2014) for Recheck.

## 2014-06-02 NOTE — Assessment & Plan Note (Signed)
Wt Readings from Last 3 Encounters:  06/02/14 288 lb 2 oz (130.693 kg)  04/14/14 286 lb 8 oz (129.956 kg)  09/29/13 286 lb (129.729 kg)   Body mass index is 35.53 kg/(m^2). Encouraged healthy diet and exercise with goal of weight loss.

## 2014-06-03 LAB — H. PYLORI BREATH TEST: H. pylori Breath Test: NOT DETECTED

## 2014-06-09 ENCOUNTER — Encounter: Payer: Self-pay | Admitting: Internal Medicine

## 2014-07-09 ENCOUNTER — Encounter: Payer: Self-pay | Admitting: Internal Medicine

## 2014-07-13 ENCOUNTER — Ambulatory Visit: Payer: BLUE CROSS/BLUE SHIELD | Admitting: Internal Medicine

## 2014-10-14 ENCOUNTER — Ambulatory Visit (INDEPENDENT_AMBULATORY_CARE_PROVIDER_SITE_OTHER): Payer: BLUE CROSS/BLUE SHIELD | Admitting: Internal Medicine

## 2014-10-14 ENCOUNTER — Encounter: Payer: Self-pay | Admitting: Internal Medicine

## 2014-10-14 VITALS — BP 121/82 | HR 96 | Temp 98.2°F | Ht 75.0 in | Wt 285.4 lb

## 2014-10-14 DIAGNOSIS — F4322 Adjustment disorder with anxiety: Secondary | ICD-10-CM

## 2014-10-14 DIAGNOSIS — Z0000 Encounter for general adult medical examination without abnormal findings: Secondary | ICD-10-CM

## 2014-10-14 DIAGNOSIS — M25552 Pain in left hip: Secondary | ICD-10-CM | POA: Diagnosis not present

## 2014-10-14 DIAGNOSIS — E785 Hyperlipidemia, unspecified: Secondary | ICD-10-CM

## 2014-10-14 DIAGNOSIS — I10 Essential (primary) hypertension: Secondary | ICD-10-CM | POA: Diagnosis not present

## 2014-10-14 DIAGNOSIS — E669 Obesity, unspecified: Secondary | ICD-10-CM

## 2014-10-14 LAB — VITAMIN D 25 HYDROXY (VIT D DEFICIENCY, FRACTURES): VITD: 19.47 ng/mL — ABNORMAL LOW (ref 30.00–100.00)

## 2014-10-14 LAB — CBC WITH DIFFERENTIAL/PLATELET
Basophils Absolute: 0 10*3/uL (ref 0.0–0.1)
Basophils Relative: 0.4 % (ref 0.0–3.0)
Eosinophils Absolute: 0.2 10*3/uL (ref 0.0–0.7)
Eosinophils Relative: 3.1 % (ref 0.0–5.0)
HCT: 46.3 % (ref 39.0–52.0)
Hemoglobin: 16.1 g/dL (ref 13.0–17.0)
Lymphocytes Relative: 25.6 % (ref 12.0–46.0)
Lymphs Abs: 1.7 10*3/uL (ref 0.7–4.0)
MCHC: 34.8 g/dL (ref 30.0–36.0)
MCV: 85.1 fl (ref 78.0–100.0)
Monocytes Absolute: 0.5 10*3/uL (ref 0.1–1.0)
Monocytes Relative: 6.9 % (ref 3.0–12.0)
Neutro Abs: 4.3 10*3/uL (ref 1.4–7.7)
Neutrophils Relative %: 64 % (ref 43.0–77.0)
Platelets: 216 10*3/uL (ref 150.0–400.0)
RBC: 5.44 Mil/uL (ref 4.22–5.81)
RDW: 13.3 % (ref 11.5–15.5)
WBC: 6.8 10*3/uL (ref 4.0–10.5)

## 2014-10-14 LAB — LDL CHOLESTEROL, DIRECT: Direct LDL: 137 mg/dL

## 2014-10-14 LAB — TSH: TSH: 2.2 u[IU]/mL (ref 0.35–4.50)

## 2014-10-14 LAB — COMPREHENSIVE METABOLIC PANEL
ALT: 42 U/L (ref 0–53)
AST: 27 U/L (ref 0–37)
Albumin: 4.4 g/dL (ref 3.5–5.2)
Alkaline Phosphatase: 61 U/L (ref 39–117)
BUN: 17 mg/dL (ref 6–23)
CO2: 30 mEq/L (ref 19–32)
Calcium: 9.5 mg/dL (ref 8.4–10.5)
Chloride: 101 mEq/L (ref 96–112)
Creatinine, Ser: 1.04 mg/dL (ref 0.40–1.50)
GFR: 82.44 mL/min (ref >60.00)
Glucose, Bld: 99 mg/dL (ref 70–99)
Potassium: 4.4 mEq/L (ref 3.5–5.1)
Sodium: 139 mEq/L (ref 135–145)
Total Bilirubin: 0.9 mg/dL (ref 0.2–1.2)
Total Protein: 7 g/dL (ref 6.0–8.3)

## 2014-10-14 LAB — HEMOGLOBIN A1C: Hgb A1c MFr Bld: 5.4 % (ref 4.6–6.5)

## 2014-10-14 LAB — LIPID PANEL
Cholesterol: 219 mg/dL — ABNORMAL HIGH (ref 0–200)
HDL: 38.8 mg/dL — ABNORMAL LOW (ref >39.00)
NonHDL: 180.2
Total CHOL/HDL Ratio: 6
Triglycerides: 209 mg/dL — ABNORMAL HIGH (ref 0.0–149.0)
VLDL: 41.8 mg/dL — ABNORMAL HIGH (ref 0.0–40.0)

## 2014-10-14 LAB — MICROALBUMIN / CREATININE URINE RATIO
Creatinine,U: 123.3 mg/dL
Microalb Creat Ratio: 0.6 mg/g (ref 0.0–30.0)
Microalb, Ur: <0.7 mg/dL (ref 0.0–1.9)

## 2014-10-14 MED ORDER — ALPRAZOLAM 0.25 MG PO TABS: 0.2500 mg | ORAL_TABLET | Freq: Three times a day (TID) | ORAL | Status: DC | PRN

## 2014-10-14 MED ORDER — ESCITALOPRAM OXALATE 5 MG PO TABS: 5.0000 mg | ORAL_TABLET | Freq: Every day | ORAL | Status: DC

## 2014-10-14 NOTE — Patient Instructions (Addendum)
Start Lexapro  daily. Labs today. Follow up in 4 weeks.  Health Maintenance A healthy lifestyle and preventative care can promote health and wellness.  Maintain regular health, dental, and eye exams.  Eat a healthy diet. Foods like vegetables, fruits, whole grains, low-fat dairy products, and lean protein foods contain the nutrients you need and are low in calories. Decrease your intake of foods high in solid fats, added sugars, and salt. Get information about a proper diet from your health care provider, if necessary.  Regular physical exercise is one of the most important things you can do for your health. Most adults should get at least 150 minutes of moderate-intensity exercise (any activity that increases your heart rate and causes you to sweat) each week. In addition, most adults need muscle-strengthening exercises on 2 or more days a week.   Maintain a healthy weight. The body mass index (BMI) is a screening tool to identify possible weight problems. It provides an estimate of body fat based on height and weight. Your health care provider can find your BMI and can help you achieve or maintain a healthy weight. For males 20 years and older:  A BMI below 18.5 is considered underweight.  A BMI of 18.5 to 24.9 is normal.  A BMI of 25 to 29.9 is considered overweight.  A BMI of 30 and above is considered obese.  Maintain normal blood lipids and cholesterol by exercising and minimizing your intake of saturated fat. Eat a balanced diet with plenty of fruits and vegetables. Blood tests for lipids and cholesterol should begin at age 49 and be repeated every 5 years. If your lipid or cholesterol levels are high, you are over age 57, or you are at high risk for heart disease, you may need your cholesterol levels checked more frequently.Ongoing high lipid and cholesterol levels should be treated with medicines if diet and exercise are not working.  If you smoke, find out from your health  care provider how to quit. If you do not use tobacco, do not start.  Lung cancer screening is recommended for adults aged 55-80 years who are at high risk for developing lung cancer because of a history of smoking. A yearly low-dose CT scan of the lungs is recommended for people who have at least a 30-pack-year history of smoking and are current smokers or have quit within the past 15 years. A pack year of smoking is smoking an average of 1 pack of cigarettes a day for 1 year (for example, a 30-pack-year history of smoking could mean smoking 1 pack a day for 30 years or 2 packs a day for 15 years). Yearly screening should continue until the smoker has stopped smoking for at least 15 years. Yearly screening should be stopped for people who develop a health problem that would prevent them from having lung cancer treatment.  If you choose to drink alcohol, do not have more than 2 drinks per day. One drink is considered to be 12 oz (360 mL) of beer, 5 oz (150 mL) of wine, or 1.5 oz (45 mL) of liquor.  Avoid the use of street drugs. Do not share needles with anyone. Ask for help if you need support or instructions about stopping the use of drugs.  High blood pressure causes heart disease and increases the risk of stroke. Blood pressure should be checked at least every 1-2 years. Ongoing high blood pressure should be treated with medicines if weight loss and exercise are not effective.  If you are 28-36 years old, ask your health care provider if you should take aspirin to prevent heart disease.  Diabetes screening involves taking a blood sample to check your fasting blood sugar level. This should be done once every 3 years after age 36 if you are at a normal weight and without risk factors for diabetes. Testing should be considered at a younger age or be carried out more frequently if you are overweight and have at least 1 risk factor for diabetes.  Colorectal cancer can be detected and often prevented.  Most routine colorectal cancer screening begins at the age of 81 and continues through age 25. However, your health care provider may recommend screening at an earlier age if you have risk factors for colon cancer. On a yearly basis, your health care provider may provide home test kits to check for hidden blood in the stool. A small camera at the end of a tube may be used to directly examine the colon (sigmoidoscopy or colonoscopy) to detect the earliest forms of colorectal cancer. Talk to your health care provider about this at age 90 when routine screening begins. A direct exam of the colon should be repeated every 5-10 years through age 69, unless early forms of precancerous polyps or small growths are found.  People who are at an increased risk for hepatitis B should be screened for this virus. You are considered at high risk for hepatitis B if:  You were born in a country where hepatitis B occurs often. Talk with your health care provider about which countries are considered high risk.  Your parents were born in a high-risk country and you have not received a shot to protect against hepatitis B (hepatitis B vaccine).  You have HIV or AIDS.  You use needles to inject street drugs.  You live with, or have sex with, someone who has hepatitis B.  You are a man who has sex with other men (MSM).  You get hemodialysis treatment.  You take certain medicines for conditions like cancer, organ transplantation, and autoimmune conditions.  Hepatitis C blood testing is recommended for all people born from [birth year] through 1965 and any individual with known risk factors for hepatitis C.  Healthy men should no longer receive prostate-specific antigen (PSA) blood tests as part of routine cancer screening. Talk to your health care provider about prostate cancer screening.  Testicular cancer screening is not recommended for adolescents or adult males who have no symptoms. Screening includes self-exam, a health  care provider exam, and other screening tests. Consult with your health care provider about any symptoms you have or any concerns you have about testicular cancer.  Practice safe sex. Use condoms and avoid high-risk sexual practices to reduce the spread of sexually transmitted infections (STIs).  You should be screened for STIs, including gonorrhea and chlamydia if:  You are sexually active and are younger than 24 years.  You are older than 24 years, and your health care provider tells you that you are at risk for this type of infection.  Your sexual activity has changed since you were last screened, and you are at an increased risk for chlamydia or gonorrhea. Ask your health care provider if you are at risk.  If you are at risk of being infected with HIV, it is recommended that you take a prescription medicine daily to prevent HIV infection. This is called pre-exposure prophylaxis (PrEP). You are considered at risk if:  You are a man who has  sex with other men (MSM).  You are a heterosexual man who is sexually active with multiple partners.  You take drugs by injection.  You are sexually active with a partner who has HIV.  Talk with your health care provider about whether you are at high risk of being infected with HIV. If you choose to begin PrEP, you should first be tested for HIV. You should then be tested every 3 months for as long as you are taking PrEP.  Use sunscreen. Apply sunscreen liberally and repeatedly throughout the day. You should seek shade when your shadow is shorter than you. Protect yourself by wearing long sleeves, pants, a wide-brimmed hat, and sunglasses year round whenever you are outdoors.  Tell your health care provider of new moles or changes in moles, especially if there is a change in shape or color. Also, tell your health care provider if a mole is larger than the size of a pencil eraser.  A one-time screening for abdominal aortic aneurysm (AAA) and surgical  repair of large AAAs by ultrasound is recommended for men aged 40-75 years who are current or former smokers.  Stay current with your vaccines (immunizations). Document Released: 09/02/2007 Document Revised: 03/11/2013 Document Reviewed: 08/01/2010 [facility] Patient Information 2015 [place], [place]. This information is not intended to replace advice given to you by your health care provider. Make sure you discuss any questions you have with your health care provider.

## 2014-10-14 NOTE — Progress Notes (Signed)
Subjective:    Patient ID: Todd Willis, male    DOB: Oct 22, 1970, 44 y.o.   MRN: [id number]  HPI  44YO male presents for annual exam.  HTN - BP at home typically 135/90s mostly. Concerned that medication is making him feel poorly during the day. Not lightheaded. Improves at night.  Weekends he does not feel this way. Questions if this may be related to anxiety. Has some anxiety at work and in dealing with his 14YO daughter. In the past, took medication to help with anxiety. Has not recently used medication for this.  Has some occasional left hip pain. Described as quick sharp pain lateral left hip. Only lasts a few min. Does not take anything for this. No weakness, numbness.  BP Readings from Last 3 Encounters:  10/14/14 121/82  06/02/14 142/96  04/14/14 135/86     Past medical, surgical, family and social history per today's encounter.  Review of Systems  Constitutional: Negative for fever, chills, activity change, appetite change, fatigue and unexpected weight change.  Eyes: Negative for visual disturbance.  Respiratory: Negative for cough and shortness of breath.   Cardiovascular: Negative for chest pain, palpitations and leg swelling.  Gastrointestinal: Negative for nausea, vomiting, abdominal pain, diarrhea, constipation and abdominal distention.  Genitourinary: Negative for dysuria, urgency and difficulty urinating.  Musculoskeletal: Positive for arthralgias. Negative for gait problem.  Skin: Negative for color change and rash.  Hematological: Negative for adenopathy.  Psychiatric/Behavioral: Negative for suicidal ideas, sleep disturbance and dysphoric mood. The patient is nervous/anxious.        Objective:    BP 121/82 mmHg  Pulse 96  Temp(Src) 98.2 F (36.8 C) (Oral)  Ht 6\' 3"  (1.905 m)  Wt 285 lb 6 oz (129.445 kg)  BMI 35.67 kg/m2  SpO2 99% Physical Exam  Constitutional: He is oriented to person, place, and time. He appears well-developed and  well-nourished. No distress.  HENT:  Head: Normocephalic and atraumatic.  Right Ear: External ear normal.  Left Ear: External ear normal.  Nose: Nose normal.  Mouth/Throat: Oropharynx is clear and moist. No oropharyngeal exudate.  Eyes: Conjunctivae and EOM are normal. Pupils are equal, round, and reactive to light. Right eye exhibits no discharge. Left eye exhibits no discharge. No scleral icterus.  Neck: Normal range of motion. Neck supple. No tracheal deviation present. No thyromegaly present.  Cardiovascular: Normal rate, regular rhythm and normal heart sounds.  Exam reveals no gallop and no friction rub.   No murmur heard. Pulmonary/Chest: Effort normal and breath sounds normal. No accessory muscle usage. No tachypnea. No respiratory distress. He has no decreased breath sounds. He has no wheezes. He has no rhonchi. He has no rales. He exhibits no tenderness.  Abdominal: Soft. Bowel sounds are normal. He exhibits no distension and no mass. There is no tenderness. There is no rebound and no guarding.  Musculoskeletal: Normal range of motion. He exhibits no edema.  Lymphadenopathy:    He has no cervical adenopathy.  Neurological: He is alert and oriented to person, place, and time. No cranial nerve deficit. Coordination normal.  Skin: Skin is warm and dry. No rash noted. He is not diaphoretic. No erythema. No pallor.  Psychiatric: His speech is normal and behavior is normal. Judgment and thought content normal. His mood appears anxious.          Assessment & Plan:   Problem List Items Addressed This Visit      Unprioritized   Adjustment disorder with anxious mood  Recent worsening anxiety both at work and at home. Will add Lexapro  daily. Continue prn Alprazolam. Follow up in 1 month and prn.      Relevant Medications   escitalopram (LEXAPRO) 5 MG tablet   Hyperlipidemia    Check lipids with labs today.      Hypertension    BP Readings from Last 3 Encounters:    10/14/14 121/82  06/02/14 142/96  04/14/14 135/86   BP well controlled. Will continue current medication. Renal function with labs.      Left hip pain    Left hip pain. Fleeting symptoms. Exam normal. Question bursitis exacerbated by physical activity or IT band syndrome. Encouraged him to use a rollerbar and ice area. If any persistent symptoms, he will let us know.      Obesity (BMI 30-39.9)    Wt Readings from Last 3 Encounters:  10/14/14 285 lb 6 oz (129.445 kg)  06/02/14 288 lb 2 oz (130.693 kg)  04/14/14 286 lb 8 oz (129.956 kg)   Body mass index is 35.67 kg/(m^2). Encouraged healthy diet and exercise.      Routine general medical examination at a health care facility - Primary    General medical exam normal today. Encouraged healthy diet and exercise. Labs as ordered. Immunizations are UTD. Reviewed screening guidelines for both prostate and colon cancer.      Relevant Orders   TSH   CBC with Differential/Platelet   Comprehensive metabolic panel   Lipid panel   Microalbumin / creatinine urine ratio   Vit D  25 hydroxy (rtn osteoporosis monitoring)   Hemoglobin A1c       Return in about 4 weeks (around 11/11/2014) for Recheck.

## 2014-10-14 NOTE — Progress Notes (Signed)
Pre visit review using our clinic review tool, if applicable. No additional management support is needed unless otherwise documented below in the visit note. 

## 2014-10-14 NOTE — Assessment & Plan Note (Signed)
Left hip pain. Fleeting symptoms. Exam normal. Question bursitis exacerbated by physical activity or IT band syndrome. Encouraged him to use a rollerbar and ice area. If any persistent symptoms, he will let us know.

## 2014-10-14 NOTE — Assessment & Plan Note (Signed)
Recent worsening anxiety both at work and at home. Will add Lexapro  daily. Continue prn Alprazolam. Follow up in 1 month and prn.

## 2014-10-14 NOTE — Assessment & Plan Note (Signed)
Check lipids with labs today

## 2014-10-14 NOTE — Assessment & Plan Note (Signed)
BP Readings from Last 3 Encounters:  10/14/14 121/82  06/02/14 142/96  04/14/14 135/86   BP well controlled. Will continue current medication. Renal function with labs.

## 2014-10-14 NOTE — Assessment & Plan Note (Signed)
Wt Readings from Last 3 Encounters:  10/14/14 285 lb 6 oz (129.445 kg)  06/02/14 288 lb 2 oz (130.693 kg)  04/14/14 286 lb 8 oz (129.956 kg)   Body mass index is 35.67 kg/(m^2). Encouraged healthy diet and exercise.

## 2014-10-14 NOTE — Assessment & Plan Note (Signed)
General medical exam normal today. Encouraged healthy diet and exercise. Labs as ordered. Immunizations are UTD. Reviewed screening guidelines for both prostate and colon cancer.

## 2014-11-16 ENCOUNTER — Encounter: Payer: Self-pay | Admitting: Internal Medicine

## 2014-11-17 ENCOUNTER — Ambulatory Visit: Payer: BLUE CROSS/BLUE SHIELD | Admitting: Internal Medicine

## 2015-04-25 ENCOUNTER — Other Ambulatory Visit: Payer: Self-pay | Admitting: Internal Medicine

## 2015-06-29 DIAGNOSIS — L500 Allergic urticaria: Secondary | ICD-10-CM | POA: Diagnosis not present

## 2015-07-10 ENCOUNTER — Other Ambulatory Visit: Payer: Self-pay | Admitting: Internal Medicine

## 2015-10-08 ENCOUNTER — Other Ambulatory Visit: Payer: Self-pay | Admitting: Internal Medicine

## 2015-10-18 ENCOUNTER — Telehealth: Payer: Self-pay | Admitting: *Deleted

## 2015-10-20 ENCOUNTER — Encounter: Payer: Self-pay | Admitting: Internal Medicine

## 2015-11-04 ENCOUNTER — Other Ambulatory Visit: Payer: Self-pay

## 2015-11-04 MED ORDER — LOSARTAN POTASSIUM-HCTZ 50-12.5 MG PO TABS: 1.0000 | ORAL_TABLET | Freq: Every day | ORAL | 0 refills | Status: DC

## 2015-11-12 ENCOUNTER — Ambulatory Visit (INDEPENDENT_AMBULATORY_CARE_PROVIDER_SITE_OTHER): Payer: BLUE CROSS/BLUE SHIELD | Admitting: Family Medicine

## 2015-11-12 ENCOUNTER — Encounter: Payer: Self-pay | Admitting: Family Medicine

## 2015-11-12 VITALS — BP 130/100 | HR 80 | Temp 98.1°F | Resp 16 | Ht 75.0 in | Wt 295.0 lb

## 2015-11-12 DIAGNOSIS — Z0000 Encounter for general adult medical examination without abnormal findings: Secondary | ICD-10-CM | POA: Diagnosis not present

## 2015-11-12 DIAGNOSIS — E669 Obesity, unspecified: Secondary | ICD-10-CM | POA: Diagnosis not present

## 2015-11-12 DIAGNOSIS — Z130 Encounter for screening for diseases of the blood and blood-forming organs and certain disorders involving the immune mechanism: Secondary | ICD-10-CM

## 2015-11-12 DIAGNOSIS — I10 Essential (primary) hypertension: Secondary | ICD-10-CM

## 2015-11-12 DIAGNOSIS — E785 Hyperlipidemia, unspecified: Secondary | ICD-10-CM | POA: Diagnosis not present

## 2015-11-12 LAB — COMPREHENSIVE METABOLIC PANEL
ALT: 41 U/L (ref 0–53)
AST: 26 U/L (ref 0–37)
Albumin: 4.3 g/dL (ref 3.5–5.2)
Alkaline Phosphatase: 57 U/L (ref 39–117)
BUN: 14 mg/dL (ref 6–23)
CO2: 30 mEq/L (ref 19–32)
Calcium: 9.5 mg/dL (ref 8.4–10.5)
Chloride: 103 mEq/L (ref 96–112)
Creatinine, Ser: 1.05 mg/dL (ref 0.40–1.50)
GFR: 81.13 mL/min (ref >60.00)
Glucose, Bld: 116 mg/dL — ABNORMAL HIGH (ref 70–99)
Potassium: 4.3 mEq/L (ref 3.5–5.1)
Sodium: 140 mEq/L (ref 135–145)
Total Bilirubin: 0.7 mg/dL (ref 0.2–1.2)
Total Protein: 7.2 g/dL (ref 6.0–8.3)

## 2015-11-12 LAB — LIPID PANEL
Cholesterol: 209 mg/dL — ABNORMAL HIGH (ref 0–200)
HDL: 43.2 mg/dL (ref >39.00)
LDL Cholesterol: 142 mg/dL — ABNORMAL HIGH (ref 0–99)
NonHDL: 165.65
Total CHOL/HDL Ratio: 5
Triglycerides: 116 mg/dL (ref 0.0–149.0)
VLDL: 23.2 mg/dL (ref 0.0–40.0)

## 2015-11-12 LAB — CBC
HCT: 44.5 % (ref 39.0–52.0)
Hemoglobin: 15.3 g/dL (ref 13.0–17.0)
MCHC: 34.5 g/dL (ref 30.0–36.0)
MCV: 83.5 fl (ref 78.0–100.0)
Platelets: 232 10*3/uL (ref 150.0–400.0)
RBC: 5.32 Mil/uL (ref 4.22–5.81)
RDW: 13.5 % (ref 11.5–15.5)
WBC: 6.6 10*3/uL (ref 4.0–10.5)

## 2015-11-12 LAB — HEMOGLOBIN A1C: Hgb A1c MFr Bld: 5.6 % (ref 4.6–6.5)

## 2015-11-12 MED ORDER — LOSARTAN POTASSIUM-HCTZ 50-12.5 MG PO TABS: 1.0000 | ORAL_TABLET | Freq: Every day | ORAL | 3 refills | Status: DC

## 2015-11-12 MED ORDER — ALPRAZOLAM 0.25 MG PO TABS: 0.2500 mg | ORAL_TABLET | Freq: Three times a day (TID) | ORAL | 1 refills | Status: DC | PRN

## 2015-11-12 MED ORDER — PANTOPRAZOLE SODIUM 40 MG PO TBEC: 40.0000 mg | DELAYED_RELEASE_TABLET | Freq: Every day | ORAL | 3 refills | Status: DC

## 2015-11-12 NOTE — Progress Notes (Signed)
Subjective:  Patient ID: Todd Willis, male    DOB: Aug 03, 1970  Age: 45 y.o. MRN: [id number]  CC: Annual physical exam  HPI Todd Willis is a 45 y.o. male presents to the clinic today for an annual physical exam.  No concerns or issues.  Preventative Healthcare  Colonoscopy: Not indicated.   Immunizations  Tetanus - Up to date.  Pneumococcal - N/A.  Flu - Will get at work.  Prostate cancer screening: Not indicated at this time.  Labs: Labs today. See orders.   Alcohol use: No.  Smoking/tobacco use: Nonsmoker.  PMH, Surgical Hx, Family Hx, Social History reviewed and updated as below.  Past Medical History:  Diagnosis Date  . Anxiety    on buspar and xanax in past  . Diverticulitis   . GERD (gastroesophageal reflux disease)    uses zegrid occas  . H/O cardiovascular stress test    normal per pt  . History of MRI of brain and brain stem    Normal per pt   History reviewed. No pertinent surgical history. Family History  Problem Relation Age of Onset  . Hypertension Mother   . Aneurysm Mother   . Hyperlipidemia Father   . Heart disease Father   . Stroke Father 37  . Hypertension Father   . Diabetes Father   . Heart disease Maternal Grandmother   . Hypertension Maternal Grandmother   . Depression Maternal Grandmother   . Aneurysm Maternal Grandmother   . Heart disease Maternal Grandfather   . Hypertension Maternal Grandfather   . Depression Maternal Grandfather   . Aneurysm Maternal Grandfather   . Stroke Paternal Grandfather 53   Social History  Substance Use Topics  . Smoking status: Never Smoker  . Smokeless tobacco: Never Used  . Alcohol use No   Review of Systems  Psychiatric/Behavioral: The patient is nervous/anxious.   All other systems reviewed and are negative.  Objective:   Today's Vitals: BP (!) 130/100 (BP Location: Left Arm, Patient Position: Sitting, Cuff Size: Large)   Pulse 80   Temp 98.1 F (36.7 C) (Oral)   Resp 16    Ht _0  (1.905 m)   Wt 295 lb (133.8 kg)   SpO2 98%   BMI 36.87 kg/m   Physical Exam  Constitutional: He is oriented to person, place, and time. He appears well-developed and well-nourished. No distress.  HENT:  Head: Normocephalic and atraumatic.  Mouth/Throat: Oropharynx is clear and moist. No oropharyngeal exudate.  L TM obscured by cerumen. R TM normal.  Eyes: Conjunctivae are normal. No scleral icterus.  Neck: Neck supple.  Cardiovascular: Normal rate and regular rhythm.   No murmur heard. Pulmonary/Chest: Effort normal and breath sounds normal. He has no wheezes. He has no rales.  Abdominal: Soft. He exhibits no distension. There is no tenderness. There is no rebound and no guarding.  Musculoskeletal: Normal range of motion. He exhibits no edema.  Lymphadenopathy:    He has no cervical adenopathy.  Neurological: He is alert and oriented to person, place, and time.  Skin: Skin is warm and dry. No rash noted.  Psychiatric: He has a normal mood and affect.  Vitals reviewed.  Assessment & Plan:   Problem List Items Addressed This Visit    Hypertension   Relevant Medications   losartan-hydrochlorothiazide (HYZAAR) 50-12.5 MG tablet   Other Relevant Orders   Comp Met (CMET)   Hyperlipidemia   Relevant Medications   losartan-hydrochlorothiazide (HYZAAR) 50-12.5 MG tablet   Other Relevant  Orders   Lipid Profile   Obesity (BMI 30-39.9)   Relevant Orders   HgB A1c   Annual physical exam    Meds refilled today. BP slightly elevated. Patient to check at home. Preventative health care up to date minus HIV screening. Labs today.       Other Visit Diagnoses    Screening for deficiency anemia    -  Primary   Relevant Orders   CBC      Outpatient Encounter Prescriptions as of 11/12/2015  Medication Sig  . ALPRAZolam (XANAX) 0.25 MG tablet Take 1 tablet (0.25 mg total) by mouth 3 (three) times daily as needed for anxiety.  Marland Kitchen aspirin EC 81 MG tablet Take 1 tablet (81 mg  total) by mouth daily.  Marland Kitchen losartan-hydrochlorothiazide (HYZAAR) 50-12.5 MG tablet Take 1 tablet by mouth daily.  . Multiple Vitamin (MULTIVITAMIN) tablet Take 1 tablet by mouth daily.  . pantoprazole (PROTONIX) 40 MG tablet Take 1 tablet (40 mg total) by mouth daily.  . [DISCONTINUED] ALPRAZolam (XANAX) 0.25 MG tablet Take 1 tablet (0.25 mg total) by mouth 3 (three) times daily as needed for anxiety.  . [DISCONTINUED] losartan-hydrochlorothiazide (HYZAAR) 50-12.5 MG tablet Take 1 tablet by mouth daily.  . [DISCONTINUED] pantoprazole (PROTONIX) 40 MG tablet TAKE 1 TABLET BY MOUTH TWICE DAILY (Patient taking differently: TAKE 1 TABLET BY MOUTH DAILY)  . [DISCONTINUED] escitalopram (LEXAPRO) 5 MG tablet Take 1 tablet (5 mg total) by mouth daily.   No facility-administered encounter medications on file as of 11/12/2015.    Follow-up: Annually  [place]

## 2015-11-12 NOTE — Patient Instructions (Signed)
Check your BP's over the next 2 weeks. If there are regularly elevated >140/90 please let me know.  Health Maintenance, Male A healthy lifestyle and preventative care can promote health and wellness.  Maintain regular health, dental, and eye exams.  Eat a healthy diet. Foods like vegetables, fruits, whole grains, low-fat dairy products, and lean protein foods contain the nutrients you need and are low in calories. Decrease your intake of foods high in solid fats, added sugars, and salt. Get information about a proper diet from your health care provider, if necessary.  Regular physical exercise is one of the most important things you can do for your health. Most adults should get at least 150 minutes of moderate-intensity exercise (any activity that increases your heart rate and causes you to sweat) each week. In addition, most adults need muscle-strengthening exercises on 2 or more days a week.   Maintain a healthy weight. The body mass index (BMI) is a screening tool to identify possible weight problems. It provides an estimate of body fat based on height and weight. Your health care provider can find your BMI and can help you achieve or maintain a healthy weight. For males 20 years and older:  A BMI below 18.5 is considered underweight.  A BMI of 18.5 to 24.9 is normal.  A BMI of 25 to 29.9 is considered overweight.  A BMI of 30 and above is considered obese.  Maintain normal blood lipids and cholesterol by exercising and minimizing your intake of saturated fat. Eat a balanced diet with plenty of fruits and vegetables. Blood tests for lipids and cholesterol should begin at age 45 and be repeated every 5 years. If your lipid or cholesterol levels are high, you are over age 45, or you are at high risk for heart disease, you may need your cholesterol levels checked more frequently.Ongoing high lipid and cholesterol levels should be treated with medicines if diet and exercise are not  working.  If you smoke, find out from your health care provider how to quit. If you do not use tobacco, do not start.  Lung cancer screening is recommended for adults aged 55-80 years who are at high risk for developing lung cancer because of a history of smoking. A yearly low-dose CT scan of the lungs is recommended for people who have at least a 30-pack-year history of smoking and are current smokers or have quit within the past 15 years. A pack year of smoking is smoking an average of 1 pack of cigarettes a day for 1 year (for example, a 30-pack-year history of smoking could mean smoking 1 pack a day for 30 years or 2 packs a day for 15 years). Yearly screening should continue until the smoker has stopped smoking for at least 15 years. Yearly screening should be stopped for people who develop a health problem that would prevent them from having lung cancer treatment.  If you choose to drink alcohol, do not have more than 2 drinks per day. One drink is considered to be 12 oz (360 mL) of beer, 5 oz (150 mL) of wine, or 1.5 oz (45 mL) of liquor.  Avoid the use of street drugs. Do not share needles with anyone. Ask for help if you need support or instructions about stopping the use of drugs.  High blood pressure causes heart disease and increases the risk of stroke. High blood pressure is more likely to develop in:  People who have blood pressure in the end of the  normal range (100-139/85-89 mm Hg).  People who are overweight or obese.  People who are African American.  If you are 89-29 years of age, have your blood pressure checked every 3-5 years. If you are 53 years of age or older, have your blood pressure checked every year. You should have your blood pressure measured twice--once when you are at a hospital or clinic, and once when you are not at a hospital or clinic. Record the average of the two measurements. To check your blood pressure when you are not at a hospital or clinic, you can  use:  An automated blood pressure machine at a pharmacy.  A home blood pressure monitor.  If you are 66-48 years old, ask your health care provider if you should take aspirin to prevent heart disease.  Diabetes screening involves taking a blood sample to check your fasting blood sugar level. This should be done once every 3 years after age 38 if you are at a normal weight and without risk factors for diabetes. Testing should be considered at a younger age or be carried out more frequently if you are overweight and have at least 1 risk factor for diabetes.  Colorectal cancer can be detected and often prevented. Most routine colorectal cancer screening begins at the age of 20 and continues through age 72. However, your health care provider may recommend screening at an earlier age if you have risk factors for colon cancer. On a yearly basis, your health care provider may provide home test kits to check for hidden blood in the stool. A small camera at the end of a tube may be used to directly examine the colon (sigmoidoscopy or colonoscopy) to detect the earliest forms of colorectal cancer. Talk to your health care provider about this at age 17 when routine screening begins. A direct exam of the colon should be repeated every 5-10 years through age 54, unless early forms of precancerous polyps or small growths are found.  People who are at an increased risk for hepatitis B should be screened for this virus. You are considered at high risk for hepatitis B if:  You were born in a country where hepatitis B occurs often. Talk with your health care provider about which countries are considered high risk.  Your parents were born in a high-risk country and you have not received a shot to protect against hepatitis B (hepatitis B vaccine).  You have HIV or AIDS.  You use needles to inject street drugs.  You live with, or have sex with, someone who has hepatitis B.  You are a man who has sex with other  men (MSM).  You get hemodialysis treatment.  You take certain medicines for conditions like cancer, organ transplantation, and autoimmune conditions.  Hepatitis C blood testing is recommended for all people born from [birth year] through 1965 and any individual with known risk factors for hepatitis C.  Healthy men should no longer receive prostate-specific antigen (PSA) blood tests as part of routine cancer screening. Talk to your health care provider about prostate cancer screening.  Testicular cancer screening is not recommended for adolescents or adult males who have no symptoms. Screening includes self-exam, a health care provider exam, and other screening tests. Consult with your health care provider about any symptoms you have or any concerns you have about testicular cancer.  Practice safe sex. Use condoms and avoid high-risk sexual practices to reduce the spread of sexually transmitted infections (STIs).  You should be screened for  STIs, including gonorrhea and chlamydia if:  You are sexually active and are younger than 24 years.  You are older than 24 years, and your health care provider tells you that you are at risk for this type of infection.  Your sexual activity has changed since you were last screened, and you are at an increased risk for chlamydia or gonorrhea. Ask your health care provider if you are at risk.  If you are at risk of being infected with HIV, it is recommended that you take a prescription medicine daily to prevent HIV infection. This is called pre-exposure prophylaxis (PrEP). You are considered at risk if:  You are a man who has sex with other men (MSM).  You are a heterosexual man who is sexually active with multiple partners.  You take drugs by injection.  You are sexually active with a partner who has HIV.  Talk with your health care provider about whether you are at high risk of being infected with HIV. If you choose to begin PrEP, you should first be tested  for HIV. You should then be tested every 3 months for as long as you are taking PrEP.  Use sunscreen. Apply sunscreen liberally and repeatedly throughout the day. You should seek shade when your shadow is shorter than you. Protect yourself by wearing long sleeves, pants, a wide-brimmed hat, and sunglasses year round whenever you are outdoors.  Tell your health care provider of new moles or changes in moles, especially if there is a change in shape or color. Also, tell your health care provider if a mole is larger than the size of a pencil eraser.  A one-time screening for abdominal aortic aneurysm (AAA) and surgical repair of large AAAs by ultrasound is recommended for men aged 58-75 years who are current or former smokers.  Stay current with your vaccines (immunizations).   This information is not intended to replace advice given to you by your health care provider. Make sure you discuss any questions you have with your health care provider.   Document Released: 09/02/2007 Document Revised: 03/27/2014 Document Reviewed: 08/01/2010 Elsevier Interactive Patient Education [employer].

## 2015-11-12 NOTE — Assessment & Plan Note (Signed)
Meds refilled today. BP slightly elevated. Patient to check at home. Preventative health care up to date minus HIV screening. Labs today.

## 2015-11-19 ENCOUNTER — Ambulatory Visit (INDEPENDENT_AMBULATORY_CARE_PROVIDER_SITE_OTHER): Payer: BLUE CROSS/BLUE SHIELD | Admitting: Family Medicine

## 2015-11-19 ENCOUNTER — Encounter: Payer: Self-pay | Admitting: Family Medicine

## 2015-11-19 ENCOUNTER — Ambulatory Visit (INDEPENDENT_AMBULATORY_CARE_PROVIDER_SITE_OTHER): Payer: BLUE CROSS/BLUE SHIELD

## 2015-11-19 VITALS — BP 118/82 | HR 92 | Temp 97.8°F | Resp 16 | Wt 291.0 lb

## 2015-11-19 DIAGNOSIS — M5442 Lumbago with sciatica, left side: Secondary | ICD-10-CM

## 2015-11-19 DIAGNOSIS — M545 Low back pain: Secondary | ICD-10-CM | POA: Diagnosis not present

## 2015-11-19 MED ORDER — DICLOFENAC SODIUM 75 MG PO TBEC: 75.0000 mg | DELAYED_RELEASE_TABLET | Freq: Two times a day (BID) | ORAL | 0 refills | Status: DC

## 2015-11-19 NOTE — Progress Notes (Signed)
Subjective:  Patient ID: Todd Willis, male    DOB: Jul 29, 1970  Age: 45 y.o. MRN: [id number]  CC: Back pain  HPI:  45 year old male presents for acute visit with complaints of back pain.  Patient reports a one-year history of intermittent pain in his back and left leg. He states that on Saturday he developed an acute exacerbation. He has been experiencing low back pain as well as pain in the upper thigh. He's had radiation down his left leg and has had numbness and tingling as well. He's taken Tylenol with some improvement. Exacerbated by certain movements. No reports of incontinence. No other associated symptoms. No other complaints at this time.  Social Hx   Social History   Social History  . Marital status: Married    Spouse name: N/A  . Number of children: N/A  . Years of education: N/A   Social History Main Topics  . Smoking status: Never Smoker  . Smokeless tobacco: Never Used  . Alcohol use No  . Drug use: No  . Sexual activity: Not Asked   Other Topics Concern  . None   Social History Narrative   Lives in [place], works in [place].         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   Musculoskeletal: Positive for back pain.  Neurological:       L leg numbness.   Objective:  BP 118/82 (BP Location: Right Arm, Patient Position: Sitting, Cuff Size: Normal)   Pulse 92   Temp 97.8 F (36.6 C) (Oral)   Resp 16   Wt 291 lb (132 kg)   BMI 36.37 kg/m   BP/Weight 11/19/2015 11/12/2015 10/14/2014  Systolic BP 118 130 121  Diastolic BP 82 100 82  Wt. (Lbs) 291 295 285.38  BMI 36.37 36.87 35.67   Physical Exam  Constitutional: He is oriented to person, place, and time. He appears well-developed. No distress.  Pulmonary/Chest: Effort normal.  Musculoskeletal:  Low back - nontender to palpation. Negative straight leg raise. Normal  reflexes of the lower extremities. Normal strength.  No tenderness of the greater trochanter.  Neurological: He is alert and oriented to person, place, and time.  Skin: Skin is warm and dry. No rash noted.  Psychiatric: He has a normal mood and affect.  Vitals reviewed.  Lab Results  Component Value Date   WBC 6.6 11/12/2015   HGB 15.3 11/12/2015   HCT 44.5 11/12/2015   PLT 232.0 11/12/2015   GLUCOSE 116 (H) 11/12/2015   CHOL 209 (H) 11/12/2015   TRIG 116.0 11/12/2015   HDL 43.20 11/12/2015   LDLDIRECT 137.0 10/14/2014   LDLCALC 142 (H) 11/12/2015   ALT 41 11/12/2015   AST 26 11/12/2015   NA 140 11/12/2015   K 4.3 11/12/2015   CL 103 11/12/2015   CREATININE 1.05 11/12/2015   BUN 14 11/12/2015   CO2 30 11/12/2015   TSH 2.20 10/14/2014   PSA 0.36 09/29/2013   HGBA1C 5.6 11/12/2015   MICROALBUR <0.7 10/14/2014   Assessment & Plan:   Problem List Items Addressed This Visit    Bilateral low back pain with left-sided sciatica - Primary    New problem. Suspect lumbar radiculopathy. Treating with diclofenac. X-ray today.       Relevant Medications   diclofenac (VOLTAREN) 75 MG EC tablet   Other  Relevant Orders   DG Lumbar Spine 2-3 Views    Other Visit Diagnoses   None.     Meds ordered this encounter  Medications  . diclofenac (VOLTAREN) 75 MG EC tablet    Sig: Take 1 tablet (75 mg total) by mouth 2 (two) times daily.    Dispense:  60 tablet    Refill:  0   Follow-up: PRN  Everlene OtherJayce Jaleigha Deane DO [facility] [place]

## 2015-11-19 NOTE — Patient Instructions (Signed)
Take the diclofenac twice daily for the next 2 weeks then use twice daily as needed.  We will call with your xray results.  Take care  Dr. Adriana Simasook

## 2015-11-19 NOTE — Assessment & Plan Note (Signed)
New problem. Suspect lumbar radiculopathy. Treating with diclofenac. X-ray today.

## 2015-11-22 ENCOUNTER — Encounter: Payer: Self-pay | Admitting: Family Medicine

## 2015-11-25 ENCOUNTER — Telehealth: Payer: Self-pay | Admitting: Family Medicine

## 2015-11-25 NOTE — Telephone Encounter (Signed)
Please advise 

## 2015-11-25 NOTE — Telephone Encounter (Signed)
Pt called stating he was told to call in and schedule a left hip xray. Pt was seen on 11/25/15.  Need order please and thank you!  Call pt @ [phone].

## 2015-11-26 ENCOUNTER — Other Ambulatory Visit: Payer: Self-pay | Admitting: Family Medicine

## 2015-11-26 DIAGNOSIS — M79652 Pain in left thigh: Secondary | ICD-10-CM

## 2015-11-26 NOTE — Telephone Encounter (Signed)
Order is in.

## 2015-12-06 ENCOUNTER — Other Ambulatory Visit: Payer: Self-pay | Admitting: Family Medicine

## 2015-12-08 DIAGNOSIS — Z23 Encounter for immunization: Secondary | ICD-10-CM | POA: Diagnosis not present

## 2016-09-13 ENCOUNTER — Ambulatory Visit: Payer: BLUE CROSS/BLUE SHIELD | Admitting: Family Medicine

## 2016-10-20 ENCOUNTER — Ambulatory Visit (INDEPENDENT_AMBULATORY_CARE_PROVIDER_SITE_OTHER): Payer: BLUE CROSS/BLUE SHIELD | Admitting: Family Medicine

## 2016-10-20 ENCOUNTER — Encounter: Payer: Self-pay | Admitting: Family Medicine

## 2016-10-20 VITALS — BP 128/92 | HR 93 | Temp 99.1°F | Wt 298.1 lb

## 2016-10-20 DIAGNOSIS — I10 Essential (primary) hypertension: Secondary | ICD-10-CM

## 2016-10-20 DIAGNOSIS — E669 Obesity, unspecified: Secondary | ICD-10-CM | POA: Diagnosis not present

## 2016-10-20 DIAGNOSIS — E785 Hyperlipidemia, unspecified: Secondary | ICD-10-CM

## 2016-10-20 DIAGNOSIS — Z130 Encounter for screening for diseases of the blood and blood-forming organs and certain disorders involving the immune mechanism: Secondary | ICD-10-CM | POA: Diagnosis not present

## 2016-10-20 DIAGNOSIS — Z0000 Encounter for general adult medical examination without abnormal findings: Secondary | ICD-10-CM

## 2016-10-20 LAB — COMPREHENSIVE METABOLIC PANEL
ALT: 62 U/L — ABNORMAL HIGH (ref 0–53)
AST: 38 U/L — ABNORMAL HIGH (ref 0–37)
Albumin: 4.5 g/dL (ref 3.5–5.2)
Alkaline Phosphatase: 52 U/L (ref 39–117)
BUN: 21 mg/dL (ref 6–23)
CO2: 28 mEq/L (ref 19–32)
Calcium: 9.8 mg/dL (ref 8.4–10.5)
Chloride: 103 mEq/L (ref 96–112)
Creatinine, Ser: 1.25 mg/dL (ref 0.40–1.50)
GFR: 66.07 mL/min (ref >60.00)
Glucose, Bld: 122 mg/dL — ABNORMAL HIGH (ref 70–99)
Potassium: 4.2 mEq/L (ref 3.5–5.1)
Sodium: 140 mEq/L (ref 135–145)
Total Bilirubin: 1.1 mg/dL (ref 0.2–1.2)
Total Protein: 7.2 g/dL (ref 6.0–8.3)

## 2016-10-20 LAB — CBC
HCT: 46.4 % (ref 39.0–52.0)
Hemoglobin: 15.7 g/dL (ref 13.0–17.0)
MCHC: 33.9 g/dL (ref 30.0–36.0)
MCV: 85.4 fl (ref 78.0–100.0)
Platelets: 231 10*3/uL (ref 150.0–400.0)
RBC: 5.44 Mil/uL (ref 4.22–5.81)
RDW: 13.6 % (ref 11.5–15.5)
WBC: 5.5 10*3/uL (ref 4.0–10.5)

## 2016-10-20 LAB — LIPID PANEL
Cholesterol: 214 mg/dL — ABNORMAL HIGH (ref 0–200)
HDL: 36.4 mg/dL — ABNORMAL LOW (ref >39.00)
LDL Cholesterol: 156 mg/dL — ABNORMAL HIGH (ref 0–99)
NonHDL: 177.57
Total CHOL/HDL Ratio: 6
Triglycerides: 110 mg/dL (ref 0.0–149.0)
VLDL: 22 mg/dL (ref 0.0–40.0)

## 2016-10-20 LAB — HEMOGLOBIN A1C: Hgb A1c MFr Bld: 5.9 % (ref 4.6–6.5)

## 2016-10-20 MED ORDER — PANTOPRAZOLE SODIUM 40 MG PO TBEC: 40.0000 mg | DELAYED_RELEASE_TABLET | Freq: Every day | ORAL | 3 refills | Status: DC

## 2016-10-20 MED ORDER — LOSARTAN POTASSIUM-HCTZ 50-12.5 MG PO TABS: 1.0000 | ORAL_TABLET | Freq: Every day | ORAL | 3 refills | Status: DC

## 2016-10-20 NOTE — Patient Instructions (Signed)
Continue your medications.  Follow up in 6 months.  Take care  Dr. Avelino Herren  

## 2016-10-20 NOTE — Progress Notes (Signed)
Subjective:  Patient ID: Todd Willis, male    DOB: 06/05/1970  Age: 46 y.o. MRN: [id number]  CC: Annual physical exam  HPI Todd Devondward Delgreco is a 46 y.o. male presents to the clinic today for an annual exam.  Preventative Healthcare  Immunizations: Up to date.   Labs: Labs today.   Alcohol use: No.  Smoking/tobacco use: No.  PMH, Surgical Hx, Family Hx, Social History reviewed and updated as below.  Past Medical History:  Diagnosis Date  . Anxiety    on buspar and xanax in past  . Diverticulitis   . GERD (gastroesophageal reflux disease)    uses zegrid occas  . H/O cardiovascular stress test    normal per pt  . History of MRI of brain and brain stem    Normal per pt   No past surgical history on file. Family History  Problem Relation Age of Onset  . Hypertension Mother   . Aneurysm Mother   . Hyperlipidemia Father   . Heart disease Father   . Stroke Father [id number]  . Hypertension Father   . Diabetes Father   . Heart disease Maternal Grandmother   . Hypertension Maternal Grandmother   . Depression Maternal Grandmother   . Aneurysm Maternal Grandmother   . Heart disease Maternal Grandfather   . Hypertension Maternal Grandfather   . Depression Maternal Grandfather   . Aneurysm Maternal Grandfather   . Stroke Paternal Grandfather [id number]   Social History  Substance Use Topics  . Smoking status: Never Smoker  . Smokeless tobacco: Never Used  . Alcohol use No    Review of Systems  Constitutional: Positive for fatigue.  All other systems reviewed and are negative.   Objective:   Today's Vitals: BP (!) 128/92 (BP Location: Left Arm, Patient Position: Sitting, Cuff Size: Large)   Pulse 93   Temp 99.1 F (37.3 C) (Oral)   Wt 298 lb 2 oz (135.2 kg)   SpO2 97%   BMI 37.26 kg/m   Physical Exam  Constitutional: He is oriented to person, place, and time. He appears well-developed and well-nourished. No distress.  HENT:  Head: Normocephalic and atraumatic.    Nose: Nose normal.  Mouth/Throat: Oropharynx is clear and moist. No oropharyngeal exudate.  Normal TM's bilaterally.   Eyes: Conjunctivae are normal. No scleral icterus.  Neck: Neck supple.  Cardiovascular: Normal rate and regular rhythm.   No murmur heard. Pulmonary/Chest: Effort normal and breath sounds normal. He has no wheezes. He has no rales.  Abdominal: Soft. He exhibits no distension. There is no tenderness. There is no rebound and no guarding.  Musculoskeletal: Normal range of motion. He exhibits no edema.  Lymphadenopathy:    He has no cervical adenopathy.  Neurological: He is alert and oriented to person, place, and time.  Skin: Skin is warm and dry. No rash noted.  Psychiatric: He has a normal mood and affect.  Vitals reviewed.  Assessment & Plan:   Problem List Items Addressed This Visit    Annual physical exam - Primary    Preventative care up to date. Labs today. Medications refilled.      Hyperlipidemia   Relevant Medications   losartan-hydrochlorothiazide (HYZAAR) 50-12.5 MG tablet   Other Relevant Orders   Lipid panel   Hypertension   Relevant Medications   losartan-hydrochlorothiazide (HYZAAR) 50-12.5 MG tablet   Other Relevant Orders   Comprehensive metabolic panel    Other Visit Diagnoses    Screening for deficiency anemia  Relevant Orders   CBC   Obesity, Class II, BMI 35-39.9       Relevant Orders   Hemoglobin A1c      Meds ordered this encounter  Medications  . losartan-hydrochlorothiazide (HYZAAR) 50-12.5 MG tablet    Sig: Take 1 tablet by mouth daily.    Dispense:  90 tablet    Refill:  3  . pantoprazole (PROTONIX) 40 MG tablet    Sig: Take 1 tablet (40 mg total) by mouth daily.    Dispense:  90 tablet    Refill:  3   Follow-up: 6 months  Celese Banner Adriana Simasook DO [facility] [place]

## 2016-10-20 NOTE — Assessment & Plan Note (Signed)
Preventative care up to date. Labs today. Medications refilled.

## 2016-10-23 ENCOUNTER — Encounter: Payer: Self-pay | Admitting: Family Medicine

## 2016-10-25 ENCOUNTER — Encounter: Payer: Self-pay | Admitting: Family Medicine

## 2016-11-14 ENCOUNTER — Telehealth: Payer: Self-pay | Admitting: Radiology

## 2016-11-14 NOTE — Telephone Encounter (Signed)
Pt coming in for labs Next Wednesday, please place future orders. Thank you.  

## 2016-11-21 ENCOUNTER — Other Ambulatory Visit: Payer: Self-pay | Admitting: Family Medicine

## 2016-11-21 DIAGNOSIS — R945 Abnormal results of liver function studies: Principal | ICD-10-CM

## 2016-11-21 DIAGNOSIS — R7989 Other specified abnormal findings of blood chemistry: Secondary | ICD-10-CM

## 2016-11-22 ENCOUNTER — Other Ambulatory Visit (INDEPENDENT_AMBULATORY_CARE_PROVIDER_SITE_OTHER): Payer: BLUE CROSS/BLUE SHIELD

## 2016-11-22 ENCOUNTER — Other Ambulatory Visit: Payer: BLUE CROSS/BLUE SHIELD

## 2016-11-22 DIAGNOSIS — R7989 Other specified abnormal findings of blood chemistry: Secondary | ICD-10-CM | POA: Diagnosis not present

## 2016-11-22 DIAGNOSIS — R945 Abnormal results of liver function studies: Principal | ICD-10-CM

## 2016-11-22 LAB — HEPATIC FUNCTION PANEL
ALT: 44 U/L (ref 0–53)
AST: 29 U/L (ref 0–37)
Albumin: 4.4 g/dL (ref 3.5–5.2)
Alkaline Phosphatase: 54 U/L (ref 39–117)
Bilirubin, Direct: 0.1 mg/dL (ref 0.0–0.3)
Total Bilirubin: 0.7 mg/dL (ref 0.2–1.2)
Total Protein: 7.1 g/dL (ref 6.0–8.3)

## 2016-11-24 ENCOUNTER — Encounter: Payer: Self-pay | Admitting: Family Medicine

## 2016-11-24 ENCOUNTER — Other Ambulatory Visit: Payer: Self-pay | Admitting: Family Medicine

## 2016-11-24 MED ORDER — ROSUVASTATIN CALCIUM 10 MG PO TABS: 10.0000 mg | ORAL_TABLET | Freq: Every day | ORAL | 3 refills | Status: DC

## 2016-12-13 NOTE — Telephone Encounter (Signed)
error 

## 2016-12-14 DIAGNOSIS — Z23 Encounter for immunization: Secondary | ICD-10-CM | POA: Diagnosis not present

## 2016-12-18 ENCOUNTER — Encounter: Payer: Self-pay | Admitting: Family Medicine

## 2016-12-22 ENCOUNTER — Telehealth: Payer: Self-pay

## 2016-12-22 NOTE — Telephone Encounter (Signed)
Left message for patient to return call to our office regarding last MyChart message

## 2017-01-23 ENCOUNTER — Ambulatory Visit: Payer: BLUE CROSS/BLUE SHIELD | Admitting: Family

## 2017-01-29 ENCOUNTER — Ambulatory Visit (INDEPENDENT_AMBULATORY_CARE_PROVIDER_SITE_OTHER): Payer: BLUE CROSS/BLUE SHIELD | Admitting: Family

## 2017-01-29 ENCOUNTER — Encounter: Payer: Self-pay | Admitting: Family

## 2017-01-29 VITALS — BP 136/88 | HR 86 | Temp 98.3°F | Ht 75.0 in | Wt 302.2 lb

## 2017-01-29 DIAGNOSIS — E785 Hyperlipidemia, unspecified: Secondary | ICD-10-CM | POA: Diagnosis not present

## 2017-01-29 DIAGNOSIS — I10 Essential (primary) hypertension: Secondary | ICD-10-CM | POA: Diagnosis not present

## 2017-01-29 NOTE — Patient Instructions (Signed)
Fasting labs  Pleasure meeting you

## 2017-01-29 NOTE — Assessment & Plan Note (Signed)
At goal. Will continue current regimen. Pending BMP. 

## 2017-01-29 NOTE — Progress Notes (Signed)
Pre visit review using our clinic review tool, if applicable. No additional management support is needed unless otherwise documented below in the visit note. 

## 2017-01-29 NOTE — Progress Notes (Signed)
Subjective:    Patient ID: Todd Willis, male    DOB: 03/06/1971, 46 y.o.   MRN: [id number]  CC: Todd Willis is a 46 y.o. male who presents today for follow up.   HPI: HLD - recently increased crestor from 5mg  to 10mg . Had muscle aches and felt tired. Symptoms resolved with taking 10mg  every other day. Wanted to see if cholesterol has improved since last checked  htn- . Compliant with medication. Denies exertional chest pain or pressure, numbness or tingling radiating to left arm or jaw, palpitations, dizziness, frequent headaches, changes in vision, or shortness of breath.            HISTORY:  Past Medical History:  Diagnosis Date  . Anxiety    on buspar and xanax in past  . Diverticulitis   . GERD (gastroesophageal reflux disease)    uses zegrid occas  . H/O cardiovascular stress test    normal per pt  . History of MRI of brain and brain stem    Normal per pt   No past surgical history on file. Family History  Problem Relation Age of Onset  . Hypertension Mother   . Aneurysm Mother   . Hyperlipidemia Father   . Heart disease Father   . Stroke Father [id number]  . Hypertension Father   . Diabetes Father   . Heart disease Maternal Grandmother   . Hypertension Maternal Grandmother   . Depression Maternal Grandmother   . Aneurysm Maternal Grandmother   . Heart disease Maternal Grandfather   . Hypertension Maternal Grandfather   . Depression Maternal Grandfather   . Aneurysm Maternal Grandfather   . Stroke Paternal Grandfather [id number]    Allergies: Patient has no known allergies. Current Outpatient Medications on File Prior to Visit  Medication Sig Dispense Refill  . ALPRAZolam (XANAX) 0.25 MG tablet Take 1 tablet (0.25 mg total) by mouth 3 (three) times daily as needed for anxiety. 30 tablet 1  . aspirin EC 81 MG tablet Take 1 tablet (81 mg total) by mouth daily. 30 tablet 11  . losartan-hydrochlorothiazide (HYZAAR) 50-12.5 MG tablet Take 1 tablet by mouth daily. 90  tablet 3  . Multiple Vitamin (MULTIVITAMIN) tablet Take 1 tablet by mouth daily.    . pantoprazole (PROTONIX) 40 MG tablet Take 1 tablet (40 mg total) by mouth daily. 90 tablet 3  . rosuvastatin (CRESTOR) 10 MG tablet Take 1 tablet (10 mg total) by mouth daily. 90 tablet 3   No current facility-administered medications on file prior to visit.     Social History   Tobacco Use  . Smoking status: Never Smoker  . Smokeless tobacco: Never Used  Substance Use Topics  . Alcohol use: No  . Drug use: No    Review of Systems  Constitutional: Negative for chills and fever.  Respiratory: Negative for cough.   Cardiovascular: Negative for chest pain and palpitations.  Gastrointestinal: Negative for nausea and vomiting.  Neurological: Negative for headaches.      Objective:    BP 136/88   Pulse 86   Temp 98.3 F (36.8 C) (Oral)   Ht 6\' 3"  (1.905 m)   Wt (!) 302 lb 3.2 oz (137.1 kg)   SpO2 98%   BMI 37.77 kg/m  BP Readings from Last 3 Encounters:  01/29/17 136/88  10/20/16 (!) 128/92  11/19/15 118/82   Wt Readings from Last 3 Encounters:  01/29/17 (!) 302 lb 3.2 oz (137.1 kg)  10/20/16 298 lb 2 oz (135.2 kg)  11/19/15 291 lb (132 kg)    Physical Exam  Constitutional: He appears well-developed and well-nourished.  Cardiovascular: Regular rhythm and normal heart sounds.  Pulmonary/Chest: Effort normal and breath sounds normal. No respiratory distress. He has no wheezes. He has no rhonchi. He has no rales.  Neurological: He is alert.  Skin: Skin is warm and dry.  Psychiatric: He has a normal mood and affect. His speech is normal and behavior is normal.  Vitals reviewed.      Assessment & Plan:   Problem List Items Addressed This Visit      Cardiovascular and Mediastinum   Hypertension - Primary    At goal. Will continue current regimen. Pending BMP      Relevant Orders   Basic metabolic panel     Other   Hyperlipidemia    Advised patient that it was perfectly  agreeable to take the Crestor every other day if the side effects well controlled. Lipid panel pending.       Relevant Orders   Lipid panel       I have discontinued Todd Willis's diclofenac. I am also having him maintain his multivitamin, aspirin EC, ALPRAZolam, losartan-hydrochlorothiazide, pantoprazole, and rosuvastatin.   No orders of the defined types were placed in this encounter.   Return precautions given.   Risks, benefits, and alternatives of the medications and treatment plan prescribed today were discussed, and patient expressed understanding.   Education regarding symptom management and diagnosis given to patient on AVS.  Continue to follow with Allegra GranaArnett, Nazirah Tri G, FNP for routine health maintenance.   Todd Willis and I agreed with plan.   Rennie PlowmanMargaret Jean Alejos, FNP

## 2017-01-29 NOTE — Assessment & Plan Note (Signed)
Advised patient that it was perfectly agreeable to take the Crestor every other day if the side effects well controlled. Lipid panel pending.

## 2017-01-30 ENCOUNTER — Other Ambulatory Visit (INDEPENDENT_AMBULATORY_CARE_PROVIDER_SITE_OTHER): Payer: BLUE CROSS/BLUE SHIELD

## 2017-01-30 DIAGNOSIS — E785 Hyperlipidemia, unspecified: Secondary | ICD-10-CM | POA: Diagnosis not present

## 2017-01-30 DIAGNOSIS — I10 Essential (primary) hypertension: Secondary | ICD-10-CM

## 2017-01-30 LAB — BASIC METABOLIC PANEL
BUN: 18 mg/dL (ref 6–23)
CO2: 32 mEq/L (ref 19–32)
Calcium: 9.5 mg/dL (ref 8.4–10.5)
Chloride: 103 mEq/L (ref 96–112)
Creatinine, Ser: 1.05 mg/dL (ref 0.40–1.50)
GFR: 80.7 mL/min (ref >60.00)
Glucose, Bld: 123 mg/dL — ABNORMAL HIGH (ref 70–99)
Potassium: 4.4 mEq/L (ref 3.5–5.1)
Sodium: 140 mEq/L (ref 135–145)

## 2017-01-30 LAB — LIPID PANEL
Cholesterol: 158 mg/dL (ref 0–200)
HDL: 35.7 mg/dL — ABNORMAL LOW (ref >39.00)
LDL Cholesterol: 99 mg/dL (ref 0–99)
NonHDL: 122.04
Total CHOL/HDL Ratio: 4
Triglycerides: 117 mg/dL (ref 0.0–149.0)
VLDL: 23.4 mg/dL (ref 0.0–40.0)

## 2017-02-01 ENCOUNTER — Encounter: Payer: Self-pay | Admitting: Family

## 2017-02-02 ENCOUNTER — Telehealth: Payer: Self-pay | Admitting: Family

## 2017-02-02 NOTE — Telephone Encounter (Signed)
Let patient know labs have not been resulted yet

## 2017-02-02 NOTE — Telephone Encounter (Unsigned)
Copied from CRM [phone]. Topic: General - Other >> Feb 02, 2017 12:28 PM Raquel SarnaHayes, Teresa G wrote: Pt is needing results from labs on Tues. He is concerned it is taking so long for results  Needing to know if the current dosage prescribed is helping.

## 2017-10-02 ENCOUNTER — Encounter: Payer: Self-pay | Admitting: Family

## 2017-10-02 DIAGNOSIS — W57XXXA Bitten or stung by nonvenomous insect and other nonvenomous arthropods, initial encounter: Secondary | ICD-10-CM | POA: Diagnosis not present

## 2017-10-02 DIAGNOSIS — S90562A Insect bite (nonvenomous), left ankle, initial encounter: Secondary | ICD-10-CM | POA: Diagnosis not present

## 2017-10-02 DIAGNOSIS — L089 Local infection of the skin and subcutaneous tissue, unspecified: Secondary | ICD-10-CM | POA: Diagnosis not present

## 2017-10-02 NOTE — Telephone Encounter (Signed)
Patient notified and states he will go to urgent care or walk in today for evaluation

## 2017-10-14 ENCOUNTER — Other Ambulatory Visit: Payer: Self-pay | Admitting: Family Medicine

## 2017-10-16 ENCOUNTER — Encounter: Payer: Self-pay | Admitting: Family

## 2017-10-16 ENCOUNTER — Other Ambulatory Visit: Payer: Self-pay | Admitting: Family Medicine

## 2017-10-17 ENCOUNTER — Other Ambulatory Visit: Payer: Self-pay | Admitting: Family

## 2017-10-17 NOTE — Telephone Encounter (Signed)
Last office visit 01/29/17 No office visit scheduled  Previously prescribed by Dr Adriana Simasook

## 2017-10-17 NOTE — Telephone Encounter (Signed)
Copied from CRM [id number]. Topic: Quick Communication - Rx Refill/Question >> Oct 17, 2017  6:04 PM Stephannie LiSimmons, Dearl Rudden L, NT wrote: Medication  losartan-hydrochlorothiazide (HYZAAR) 50-12.5 MG tablet   Has the patient contacted their pharmacy? no (Agent: If no, request that the patient contact the pharmacy for the refill. (Agent: If yes, when and what did the pharmacy advise?  Preferred Pharmacy (with phone number or street name [id number] [place], [place] - [address] AT [facility] [address] [phone] (Phone) [phone] (Fax)      Agent: Please be advised that RX refills may take up to 3 business days. We ask that you follow-up with your pharmacy.

## 2017-10-18 ENCOUNTER — Other Ambulatory Visit: Payer: Self-pay

## 2017-10-18 MED ORDER — PANTOPRAZOLE SODIUM 40 MG PO TBEC: 40.0000 mg | DELAYED_RELEASE_TABLET | Freq: Every day | ORAL | 0 refills | Status: DC

## 2017-10-18 MED ORDER — LOSARTAN POTASSIUM-HCTZ 50-12.5 MG PO TABS: 1.0000 | ORAL_TABLET | Freq: Every day | ORAL | 0 refills | Status: DC

## 2017-10-18 NOTE — Telephone Encounter (Signed)
PT is out of medication and needs this filled ASAP, losartan-hydrochlorothiazide (HYZAAR) 50-12.5 MG tablet also needs a refill on pantoprazole (PROTONIX) 40 MG tablet.

## 2017-10-18 NOTE — Telephone Encounter (Signed)
Okay to fill.   Thank you kristen.  Please give 6 months and ensure he has a follow up with me

## 2017-10-18 NOTE — Telephone Encounter (Addendum)
Script sent in patient advised , you had never prescribed. I didn't know if ok to send in. Previously prescribed by Dr Adriana Simasook

## 2017-10-22 ENCOUNTER — Ambulatory Visit (INDEPENDENT_AMBULATORY_CARE_PROVIDER_SITE_OTHER): Payer: BLUE CROSS/BLUE SHIELD | Admitting: Family Medicine

## 2017-10-22 ENCOUNTER — Encounter: Payer: Self-pay | Admitting: Family Medicine

## 2017-10-22 VITALS — BP 142/86 | HR 92 | Temp 98.3°F | Resp 18 | Ht 75.0 in | Wt 307.2 lb

## 2017-10-22 DIAGNOSIS — K219 Gastro-esophageal reflux disease without esophagitis: Secondary | ICD-10-CM | POA: Diagnosis not present

## 2017-10-22 DIAGNOSIS — R5383 Other fatigue: Secondary | ICD-10-CM

## 2017-10-22 DIAGNOSIS — E785 Hyperlipidemia, unspecified: Secondary | ICD-10-CM

## 2017-10-22 DIAGNOSIS — R739 Hyperglycemia, unspecified: Secondary | ICD-10-CM

## 2017-10-22 DIAGNOSIS — I10 Essential (primary) hypertension: Secondary | ICD-10-CM | POA: Diagnosis not present

## 2017-10-22 LAB — B12 AND FOLATE PANEL
Folate: >24.1 ng/mL (ref >5.9)
Vitamin B-12: 393 pg/mL (ref 211–911)

## 2017-10-22 LAB — COMPREHENSIVE METABOLIC PANEL
ALT: 37 U/L (ref 0–53)
AST: 26 U/L (ref 0–37)
Albumin: 4.4 g/dL (ref 3.5–5.2)
Alkaline Phosphatase: 48 U/L (ref 39–117)
BUN: 17 mg/dL (ref 6–23)
CO2: 28 mEq/L (ref 19–32)
Calcium: 9.6 mg/dL (ref 8.4–10.5)
Chloride: 103 mEq/L (ref 96–112)
Creatinine, Ser: 1.08 mg/dL (ref 0.40–1.50)
GFR: 77.87 mL/min (ref >60.00)
Glucose, Bld: 135 mg/dL — ABNORMAL HIGH (ref 70–99)
Potassium: 4.1 mEq/L (ref 3.5–5.1)
Sodium: 140 mEq/L (ref 135–145)
Total Bilirubin: 1 mg/dL (ref 0.2–1.2)
Total Protein: 7 g/dL (ref 6.0–8.3)

## 2017-10-22 LAB — LIPID PANEL
Cholesterol: 145 mg/dL (ref 0–200)
HDL: 36.1 mg/dL — ABNORMAL LOW (ref >39.00)
LDL Cholesterol: 88 mg/dL (ref 0–99)
NonHDL: 108.73
Total CHOL/HDL Ratio: 4
Triglycerides: 104 mg/dL (ref 0.0–149.0)
VLDL: 20.8 mg/dL (ref 0.0–40.0)

## 2017-10-22 LAB — CBC
HCT: 41.5 % (ref 39.0–52.0)
Hemoglobin: 14.5 g/dL (ref 13.0–17.0)
MCHC: 34.9 g/dL (ref 30.0–36.0)
MCV: 82.8 fl (ref 78.0–100.0)
Platelets: 207 10*3/uL (ref 150.0–400.0)
RBC: 5.01 Mil/uL (ref 4.22–5.81)
RDW: 13.9 % (ref 11.5–15.5)
WBC: 5.9 10*3/uL (ref 4.0–10.5)

## 2017-10-22 LAB — TSH: TSH: 2.07 u[IU]/mL (ref 0.35–4.50)

## 2017-10-22 LAB — VITAMIN D 25 HYDROXY (VIT D DEFICIENCY, FRACTURES): VITD: 34.87 ng/mL (ref 30.00–100.00)

## 2017-10-22 MED ORDER — ROSUVASTATIN CALCIUM 10 MG PO TABS: 10.0000 mg | ORAL_TABLET | Freq: Every day | ORAL | 3 refills | Status: DC

## 2017-10-22 NOTE — Progress Notes (Signed)
   Subjective:    Patient ID: Todd Willis, male    DOB: 02/17/1971, 47 y.o.   MRN: [id number]  HPI   Patient presents to clinic for follow up on BP, lipids, GERD and does report fatigue.  HTN - Well controlled on losartan HCTZ  GERD - Well controlled on protonix  Lipids - Felt crestor 10mg  every day was making him too tired, so cut back to 10 mg every other day.   Fatigue -- States he will feel like he is dragging in the AM, seems a little better with cutting back on the Crestor dose. But still has days where he feels very tired.   He has not had lab work since November 2018, so we will plan to get lab work done today.   Social History   Tobacco Use  . Smoking status: Never Smoker  . Smokeless tobacco: Never Used  Substance Use Topics  . Alcohol use: No   Patient Active Problem List   Diagnosis Date Noted  . Fatigue 10/22/2017  . Hypertension 09/01/2011  . Hyperlipidemia 09/01/2011  . Anxiety 09/01/2011  . GERD (gastroesophageal reflux disease) 09/01/2011  . Obesity (BMI 30-39.9) 09/01/2011   Review of Systems  Constitutional: Positive for fatigue. Negative for chills, fever and unexpected weight change.  HENT: Negative for congestion, sinus pain, sore throat and voice change.   Eyes: Negative for photophobia, pain and visual disturbance.  Respiratory: Negative for apnea, cough, shortness of breath and wheezing.   Cardiovascular: Negative for chest pain, palpitations and leg swelling.  Gastrointestinal: Negative for abdominal pain, diarrhea, nausea and vomiting.  Endocrine: Negative for polydipsia, polyphagia and polyuria.  Genitourinary: Negative.   Musculoskeletal: Negative for arthralgias, back pain and gait problem.  Skin: Negative for color change, pallor and rash.  Neurological: Negative for dizziness, syncope and light-headedness.     Objective:   Physical Exam  Constitutional: He is oriented to person, place, and time. He appears well-developed and  well-nourished. No distress.  HENT:  Head: Normocephalic and atraumatic.  Eyes: Conjunctivae and EOM are normal. No scleral icterus.  Neck: Normal range of motion. Neck supple. No JVD present.  Cardiovascular: Normal rate, regular rhythm and normal heart sounds. Exam reveals no gallop and no friction rub.  No murmur heard. Pulmonary/Chest: Effort normal and breath sounds normal. No respiratory distress. He has no wheezes. He has no rales.  Neurological: He is alert and oriented to person, place, and time. Coordination normal.  Gait normal.  Skin: Skin is warm and dry. No pallor.  Psychiatric: He has a normal mood and affect. His behavior is normal.  Nursing note and vitals reviewed.   Vitals:   10/22/17 0804  BP: (!) 142/86  Pulse: 92  Resp: 18  Temp: 98.3 F (36.8 C)  SpO2: 98%   BP Readings from Last 3 Encounters:  10/22/17 (!) 142/86  01/29/17 136/88  10/20/16 (!) 128/92      Assessment & Plan:  HTN -- Controlled on current losartan HCTZ 50-12.5. Follow heart healthy diet.   Hyperlipidemia -- Continue Crestor 10mg  every other day. Lipid panel drawn  GERD - Stable on current meds  Fatigue -- Lab work ordered to further evaluate. Encouraged to get at least 6-8 hours of sleep per night  Follow up in about 4 weeks for CPE

## 2017-10-22 NOTE — Assessment & Plan Note (Signed)
Lab work drawn today in clinic. Patient thought fatigue may be related to crestor, seems little better with taking crestor every other day.

## 2017-10-22 NOTE — Addendum Note (Signed)
Addended by: Leanora CoverGUSE, Othella Slappey on: 10/22/2017 03:07 PM   Modules accepted: Orders

## 2017-10-22 NOTE — Assessment & Plan Note (Signed)
Unable to tolerate crestor 10mg  every day, is taking every other day. Lipid panel drawn today to assess.

## 2017-10-22 NOTE — Assessment & Plan Note (Signed)
Continue protonix 40 mg 

## 2017-10-22 NOTE — Assessment & Plan Note (Signed)
Continue Losartan HCTX 50-12.5 mg

## 2017-10-22 NOTE — Patient Instructions (Signed)
Great to meet you!  Refills for BP med, cholesterol med and GERD med sent to pharmacy  Lab work to be drawn today

## 2017-10-23 ENCOUNTER — Encounter: Payer: Self-pay | Admitting: Family

## 2017-11-02 ENCOUNTER — Other Ambulatory Visit (INDEPENDENT_AMBULATORY_CARE_PROVIDER_SITE_OTHER): Payer: BLUE CROSS/BLUE SHIELD

## 2017-11-02 DIAGNOSIS — R739 Hyperglycemia, unspecified: Secondary | ICD-10-CM

## 2017-11-02 LAB — HEMOGLOBIN A1C: Hgb A1c MFr Bld: 6.1 % (ref 4.6–6.5)

## 2017-12-06 DIAGNOSIS — Z23 Encounter for immunization: Secondary | ICD-10-CM | POA: Diagnosis not present

## 2018-01-13 ENCOUNTER — Other Ambulatory Visit: Payer: Self-pay | Admitting: Internal Medicine

## 2018-01-16 ENCOUNTER — Other Ambulatory Visit: Payer: Self-pay | Admitting: Family

## 2018-01-18 ENCOUNTER — Telehealth: Payer: Self-pay | Admitting: Family

## 2018-01-18 NOTE — Telephone Encounter (Signed)
Copied from CRM [id number]. Topic: Quick Communication - See Telephone Encounter >> Jan 18, 2018  9:31 AM Floria Raveling A wrote: CRM for notification. See Telephone encounter for: 01/18/18.  Pt called in and stated that he was under the impression that his losartan-hydrochlorothiazide (HYZAAR) 50-12.5 MG tablet [[id number]] would be refilled for at least 6 months.  He stated that the pharmacy does not have any refillls for this med.  With that being said this med is on back order and pt would like to know if this can be switched to something else similar?  Please advise    Pharmacy - [facility] on [address] number [phone]

## 2018-01-18 NOTE — Telephone Encounter (Signed)
Last office visit 10/22/17 Leotis Shames, NP To follow up 1 month CPE

## 2018-01-21 NOTE — Telephone Encounter (Signed)
Call pharmacy- do they have medication?  Do they have losartan 50mg  and HCTZ 12.5mg ? They can do separate scripts.  YOu can refill for 6 more months.  Call  Pt. He needs 6 month f/u from last OV in August. We need to look at his sugar again and BP.

## 2018-01-23 MED ORDER — LOSARTAN POTASSIUM-HCTZ 50-12.5 MG PO TABS: 1.0000 | ORAL_TABLET | Freq: Every day | ORAL | 5 refills | Status: DC

## 2018-01-23 NOTE — Telephone Encounter (Signed)
Spoke with pharmacy losartan/HCTZ 50/12.5mg  is not on back order.   Called patient back and advised that medication refilled for 6 months . Advised that he needed to schedule 6 month follow up appointment. He will call back to schedule.

## 2018-04-26 ENCOUNTER — Encounter: Payer: Self-pay | Admitting: Family

## 2018-04-26 ENCOUNTER — Ambulatory Visit (INDEPENDENT_AMBULATORY_CARE_PROVIDER_SITE_OTHER): Payer: BLUE CROSS/BLUE SHIELD | Admitting: Family

## 2018-04-26 VITALS — BP 130/88 | HR 83 | Ht 75.0 in | Wt 305.2 lb

## 2018-04-26 DIAGNOSIS — E785 Hyperlipidemia, unspecified: Secondary | ICD-10-CM | POA: Diagnosis not present

## 2018-04-26 DIAGNOSIS — K219 Gastro-esophageal reflux disease without esophagitis: Secondary | ICD-10-CM

## 2018-04-26 DIAGNOSIS — E119 Type 2 diabetes mellitus without complications: Secondary | ICD-10-CM | POA: Insufficient documentation

## 2018-04-26 DIAGNOSIS — Z125 Encounter for screening for malignant neoplasm of prostate: Secondary | ICD-10-CM | POA: Diagnosis not present

## 2018-04-26 DIAGNOSIS — E669 Obesity, unspecified: Secondary | ICD-10-CM

## 2018-04-26 DIAGNOSIS — I10 Essential (primary) hypertension: Secondary | ICD-10-CM | POA: Diagnosis not present

## 2018-04-26 DIAGNOSIS — R7303 Prediabetes: Secondary | ICD-10-CM | POA: Diagnosis not present

## 2018-04-26 LAB — COMPREHENSIVE METABOLIC PANEL
ALT: 41 U/L (ref 0–53)
AST: 25 U/L (ref 0–37)
Albumin: 4.5 g/dL (ref 3.5–5.2)
Alkaline Phosphatase: 62 U/L (ref 39–117)
BUN: 19 mg/dL (ref 6–23)
CO2: 30 mEq/L (ref 19–32)
Calcium: 9.7 mg/dL (ref 8.4–10.5)
Chloride: 102 mEq/L (ref 96–112)
Creatinine, Ser: 1.1 mg/dL (ref 0.40–1.50)
GFR: 71.57 mL/min (ref >60.00)
Glucose, Bld: 106 mg/dL — ABNORMAL HIGH (ref 70–99)
Potassium: 4.1 mEq/L (ref 3.5–5.1)
Sodium: 140 mEq/L (ref 135–145)
Total Bilirubin: 1 mg/dL (ref 0.2–1.2)
Total Protein: 7 g/dL (ref 6.0–8.3)

## 2018-04-26 LAB — LIPID PANEL
Cholesterol: 162 mg/dL (ref 0–200)
HDL: 35.2 mg/dL — ABNORMAL LOW (ref >39.00)
LDL Cholesterol: 96 mg/dL (ref 0–99)
NonHDL: 126.74
Total CHOL/HDL Ratio: 5
Triglycerides: 153 mg/dL — ABNORMAL HIGH (ref 0.0–149.0)
VLDL: 30.6 mg/dL (ref 0.0–40.0)

## 2018-04-26 LAB — PSA: PSA: 0.39 ng/mL (ref 0.10–4.00)

## 2018-04-26 LAB — HEMOGLOBIN A1C: Hgb A1c MFr Bld: 6.1 % (ref 4.6–6.5)

## 2018-04-26 MED ORDER — PANTOPRAZOLE SODIUM 40 MG PO TBEC: DELAYED_RELEASE_TABLET | ORAL | 1 refills | Status: DC

## 2018-04-26 NOTE — Assessment & Plan Note (Signed)
Compliant with medication.  Pending lipid panel 

## 2018-04-26 NOTE — Assessment & Plan Note (Signed)
Stable on PPI. No alarm symptoms today. Advised him to pursue EGD with colonoscopy

## 2018-04-26 NOTE — Assessment & Plan Note (Signed)
Advised to increase activity. Discussed Todd Willis, trial of wellbutrin. Declines at this time.

## 2018-04-26 NOTE — Assessment & Plan Note (Addendum)
Slightly elevated today. Patient will keep log at home, if consistently elevated > 120/80, he will let me know so we can adjust medications.

## 2018-04-26 NOTE — Patient Instructions (Signed)
Great to see you.  Lets focus on increase of activity and see how you are doing in 6 months.

## 2018-04-26 NOTE — Progress Notes (Signed)
Subjective:    Patient ID: Todd Willis, male    DOB: 02/24/1971, 48 y.o.   MRN: [id number]  CC: Todd Willis is a 48 y.o. male who presents today for follow up.   HPI: Doing well today  No complaints.   HLD- compliant with crestor  HTN- at home 125/80. No cp.   Prediabetic- working on diet however hard with 2 kids and their involvement with sports.   GERD- doing well on protonix. Triggered by certain food, such as [place], garlic. No trouble swallowing.   Walks 15 minutes 2 x per week. Plans to increase this.   No difficultly urinating,hesistancy.    HISTORY:  Past Medical History:  Diagnosis Date  . Anxiety    on buspar and xanax in past  . Diverticulitis   . GERD (gastroesophageal reflux disease)    uses zegrid occas  . H/O cardiovascular stress test    normal per pt  . History of MRI of brain and brain stem    Normal per pt   No past surgical history on file. Family History  Problem Relation Age of Onset  . Hypertension Mother   . Aneurysm Mother   . Hyperlipidemia Father   . Heart disease Father   . Stroke Father [id number]  . Hypertension Father   . Diabetes Father   . Heart disease Maternal Grandmother   . Hypertension Maternal Grandmother   . Depression Maternal Grandmother   . Aneurysm Maternal Grandmother   . Heart disease Maternal Grandfather   . Hypertension Maternal Grandfather   . Depression Maternal Grandfather   . Aneurysm Maternal Grandfather   . Stroke Paternal Grandfather [phone]    Allergies: Patient has no known allergies. Current Outpatient Medications on File Prior to Visit  Medication Sig Dispense Refill  . aspirin EC 81 MG tablet Take 1 tablet (81 mg total) by mouth daily. 30 tablet 11  . losartan-hydrochlorothiazide (HYZAAR) 50-12.5 MG tablet Take 1 tablet by mouth daily. 30 tablet 5  . Multiple Vitamin (MULTIVITAMIN) tablet Take 1 tablet by mouth daily.    . rosuvastatin (CRESTOR) 10 MG tablet Take 1 tablet (10 mg total) by mouth  daily. 45 tablet 3   No current facility-administered medications on file prior to visit.     Social History   Tobacco Use  . Smoking status: Never Smoker  . Smokeless tobacco: Never Used  Substance Use Topics  . Alcohol use: No  . Drug use: No    Review of Systems  Constitutional: Negative for chills and fever.  Respiratory: Negative for cough.   Cardiovascular: Negative for chest pain and palpitations.  Gastrointestinal: Negative for nausea and vomiting.  Genitourinary: Negative for difficulty urinating, frequency and urgency.      Objective:    BP 130/88 (BP Location: Left Arm, Patient Position: Sitting, Cuff Size: Large)   Pulse 83   Ht 6\' 3"  (1.905 m)   Wt (!) 305 lb 3.2 oz (138.4 kg)   SpO2 98%   BMI 38.15 kg/m  BP Readings from Last 3 Encounters:  04/26/18 130/88  10/22/17 (!) 142/86  01/29/17 136/88   Wt Readings from Last 3 Encounters:  04/26/18 (!) 305 lb 3.2 oz (138.4 kg)  10/22/17 (!) 307 lb 4 oz (139.4 kg)  01/29/17 (!) 302 lb 3.2 oz (137.1 kg)    Physical Exam Vitals signs reviewed.  Constitutional:      Appearance: He is well-developed.  Cardiovascular:     Rate and Rhythm: Regular rhythm.  Heart sounds: Normal heart sounds.  Pulmonary:     Effort: Pulmonary effort is normal. No respiratory distress.     Breath sounds: Normal breath sounds. No wheezing, rhonchi or rales.  Skin:    General: Skin is warm and dry.  Neurological:     Mental Status: He is alert.  Psychiatric:        Speech: Speech normal.        Behavior: Behavior normal.        Assessment & Plan:   Problem List Items Addressed This Visit      Cardiovascular and Mediastinum   Hypertension    Slightly elevated today. Patient will keep log at home, if consistently elevated > 120/80, he will let me know so we can adjust medications.       Relevant Orders   Comprehensive metabolic panel     Digestive   GERD (gastroesophageal reflux disease) - Primary    Stable on  PPI. No alarm symptoms today. Advised him to pursue EGD with colonoscopy       Relevant Medications   pantoprazole (PROTONIX) 40 MG tablet     Other   Hyperlipidemia    Compliant with medication. Pending lipid panel.       Relevant Orders   Lipid panel   Screening for prostate cancer   Relevant Orders   PSA   Obesity (BMI 30-39.9)    Advised to increase activity. Discussed Orlene PlumKaren Beasley, trial of wellbutrin. Declines at this time.       Prediabetes   Relevant Orders   Hemoglobin A1c       I am having Todd DevonEdward Shrum "[name]" maintain his multivitamin, aspirin EC, rosuvastatin, losartan-hydrochlorothiazide, and pantoprazole.   Meds ordered this encounter  Medications  . pantoprazole (PROTONIX) 40 MG tablet    Sig: TAKE 1 TABLET(40 MG) BY MOUTH DAILY    Dispense:  90 tablet    Refill:  1    Return precautions given.   Risks, benefits, and alternatives of the medications and treatment plan prescribed today were discussed, and patient expressed understanding.   Education regarding symptom management and diagnosis given to patient on AVS.  Continue to follow with Allegra GranaArnett, Keona Sheffler G, FNP for routine health maintenance.   Todd Willis and I agreed with plan.   Rennie PlowmanMargaret Bethany Hirt, FNP

## 2018-06-01 ENCOUNTER — Other Ambulatory Visit: Payer: Self-pay | Admitting: Family Medicine

## 2018-06-01 DIAGNOSIS — E785 Hyperlipidemia, unspecified: Secondary | ICD-10-CM

## 2018-07-10 ENCOUNTER — Encounter: Payer: Self-pay | Admitting: Family

## 2018-07-10 ENCOUNTER — Other Ambulatory Visit: Payer: Self-pay

## 2018-07-10 MED ORDER — LOSARTAN POTASSIUM-HCTZ 50-12.5 MG PO TABS: 1.0000 | ORAL_TABLET | Freq: Every day | ORAL | 1 refills | Status: DC

## 2018-10-24 ENCOUNTER — Other Ambulatory Visit: Payer: Self-pay

## 2018-10-25 ENCOUNTER — Ambulatory Visit (INDEPENDENT_AMBULATORY_CARE_PROVIDER_SITE_OTHER): Payer: BC Managed Care – PPO | Admitting: Family

## 2018-10-25 ENCOUNTER — Encounter: Payer: Self-pay | Admitting: Family

## 2018-10-25 VITALS — BP 122/82 | HR 92 | Temp 99.0°F | Ht 75.0 in | Wt 304.0 lb

## 2018-10-25 DIAGNOSIS — E785 Hyperlipidemia, unspecified: Secondary | ICD-10-CM | POA: Diagnosis not present

## 2018-10-25 DIAGNOSIS — I10 Essential (primary) hypertension: Secondary | ICD-10-CM | POA: Diagnosis not present

## 2018-10-25 DIAGNOSIS — K219 Gastro-esophageal reflux disease without esophagitis: Secondary | ICD-10-CM

## 2018-10-25 LAB — COMPREHENSIVE METABOLIC PANEL
ALT: 40 U/L (ref 0–53)
AST: 32 U/L (ref 0–37)
Albumin: 4.5 g/dL (ref 3.5–5.2)
Alkaline Phosphatase: 54 U/L (ref 39–117)
BUN: 21 mg/dL (ref 6–23)
CO2: 29 mEq/L (ref 19–32)
Calcium: 9.9 mg/dL (ref 8.4–10.5)
Chloride: 103 mEq/L (ref 96–112)
Creatinine, Ser: 1.03 mg/dL (ref 0.40–1.50)
GFR: 77.05 mL/min (ref >60.00)
Glucose, Bld: 136 mg/dL — ABNORMAL HIGH (ref 70–99)
Potassium: 4.4 mEq/L (ref 3.5–5.1)
Sodium: 139 mEq/L (ref 135–145)
Total Bilirubin: 0.7 mg/dL (ref 0.2–1.2)
Total Protein: 7.3 g/dL (ref 6.0–8.3)

## 2018-10-25 LAB — LIPID PANEL
Cholesterol: 166 mg/dL (ref 0–200)
HDL: 42.7 mg/dL (ref >39.00)
LDL Cholesterol: 99 mg/dL (ref 0–99)
NonHDL: 123.53
Total CHOL/HDL Ratio: 4
Triglycerides: 122 mg/dL (ref 0.0–149.0)
VLDL: 24.4 mg/dL (ref 0.0–40.0)

## 2018-10-25 MED ORDER — PANTOPRAZOLE SODIUM 40 MG PO TBEC: DELAYED_RELEASE_TABLET | ORAL | 1 refills | Status: DC

## 2018-10-25 MED ORDER — ROSUVASTATIN CALCIUM 10 MG PO TABS: ORAL_TABLET | ORAL | 1 refills | Status: DC

## 2018-10-25 MED ORDER — LOSARTAN POTASSIUM-HCTZ 50-12.5 MG PO TABS: 1.0000 | ORAL_TABLET | Freq: Every day | ORAL | 1 refills | Status: DC

## 2018-10-25 NOTE — Assessment & Plan Note (Signed)
Now taking crestor Qd . Pending lipid panel.

## 2018-10-25 NOTE — Patient Instructions (Signed)
Labs today  Please purchase larger blood pressure cuff to ensure bladder fits 50% around upper arm.   Monitor blood pressure,  Goal is less than 120/80, based on newest guidelines; if persistently higher, please make sooner follow up appointment so we can recheck you blood pressure and manage medications  Stay safe!

## 2018-10-25 NOTE — Progress Notes (Signed)
Subjective:    Patient ID: Todd Willis, male    DOB: Nov 13, 1970, 48 y.o.   MRN: [id number]  CC: Ulices Maack is a 48 y.o. male who presents today for follow up.   HPI: HTN  GERD  Prediabetes      HISTORY:  Past Medical History:  Diagnosis Date  . Anxiety    on buspar and xanax in past  . Diverticulitis   . GERD (gastroesophageal reflux disease)    uses zegrid occas  . H/O cardiovascular stress test    normal per pt  . History of MRI of brain and brain stem    Normal per pt   History reviewed. No pertinent surgical history. Family History  Problem Relation Age of Onset  . Hypertension Mother   . Aneurysm Mother   . Hyperlipidemia Father   . Heart disease Father   . Stroke Father 25  . Hypertension Father   . Diabetes Father   . Heart disease Maternal Grandmother   . Hypertension Maternal Grandmother   . Depression Maternal Grandmother   . Aneurysm Maternal Grandmother   . Heart disease Maternal Grandfather   . Hypertension Maternal Grandfather   . Depression Maternal Grandfather   . Aneurysm Maternal Grandfather   . Stroke Paternal Grandfather 24    Allergies: Patient has no known allergies. Current Outpatient Medications on File Prior to Visit  Medication Sig Dispense Refill  . aspirin EC 81 MG tablet Take 1 tablet (81 mg total) by mouth daily. 30 tablet 11  . Multiple Vitamin (MULTIVITAMIN) tablet Take 1 tablet by mouth daily.     No current facility-administered medications on file prior to visit.     Social History   Tobacco Use  . Smoking status: Never Smoker  . Smokeless tobacco: Never Used  Substance Use Topics  . Alcohol use: No  . Drug use: No    Review of Systems    Objective:    BP 122/82   Pulse 92   Temp 99 F (37.2 C)   Ht 6\' 3"  (1.905 m)   Wt (!) 304 lb (137.9 kg)   SpO2 96%   BMI 38.00 kg/m  BP Readings from Last 3 Encounters:  10/25/18 122/82  04/26/18 130/88  10/22/17 (!) 142/86   Wt Readings from Last 3  Encounters:  10/25/18 (!) 304 lb (137.9 kg)  04/26/18 (!) 305 lb 3.2 oz (138.4 kg)  10/22/17 (!) 307 lb 4 oz (139.4 kg)    Physical Exam     Assessment & Plan:   Problem List Items Addressed This Visit      Cardiovascular and Mediastinum   Hypertension - Primary    At goal. Education provided on size of bladder cuff as suspect his at home is too small.  He will purchase new cuff and monitor.       Relevant Medications   rosuvastatin (CRESTOR) 10 MG tablet   losartan-hydrochlorothiazide (HYZAAR) 50-12.5 MG tablet   Other Relevant Orders   Comprehensive metabolic panel   Lipid panel     Digestive   GERD (gastroesophageal reflux disease)   Relevant Medications   pantoprazole (PROTONIX) 40 MG tablet     Other   Hyperlipidemia    Now taking crestor Qd . Pending lipid panel.       Relevant Medications   rosuvastatin (CRESTOR) 10 MG tablet   losartan-hydrochlorothiazide (HYZAAR) 50-12.5 MG tablet       I am having Jackey Loge "[name]" maintain his multivitamin, aspirin  EC, pantoprazole, rosuvastatin, and losartan-hydrochlorothiazide.   Meds ordered this encounter  Medications  . pantoprazole (PROTONIX) 40 MG tablet    Sig: TAKE 1 TABLET(40 MG) BY MOUTH DAILY    Dispense:  90 tablet    Refill:  1    Order Specific Question:   Supervising Provider    Answer:   Duncan DullULLO, TERESA L [[id number]]  . rosuvastatin (CRESTOR) 10 MG tablet    Sig: TAKE 1 TABLET(10 MG) BY MOUTH DAILY    Dispense:  90 tablet    Refill:  1    Order Specific Question:   Supervising Provider    Answer:   Duncan DullULLO, TERESA L [[id number]]  . losartan-hydrochlorothiazide (HYZAAR) 50-12.5 MG tablet    Sig: Take 1 tablet by mouth daily.    Dispense:  90 tablet    Refill:  1    Order Specific Question:   Supervising Provider    Answer:   Sherlene ShamsULLO, TERESA L [[id number]]    Return precautions given.   Risks, benefits, and alternatives of the medications and treatment plan prescribed today were discussed, and patient  expressed understanding.   Education regarding symptom management and diagnosis given to patient on AVS.  Continue to follow with Allegra GranaArnett, Armando Lauman G, FNP for routine health maintenance.   Joslyn DevonEdward Grode and I agreed with plan.   Rennie PlowmanMargaret Louine Tenpenny, FNP

## 2018-10-25 NOTE — Assessment & Plan Note (Signed)
At goal. Education provided on size of bladder cuff as suspect his at home is too small.  He will purchase new cuff and monitor.

## 2018-12-10 ENCOUNTER — Encounter: Payer: Self-pay | Admitting: Family

## 2018-12-18 ENCOUNTER — Ambulatory Visit (INDEPENDENT_AMBULATORY_CARE_PROVIDER_SITE_OTHER): Payer: BC Managed Care – PPO | Admitting: Family Medicine

## 2018-12-18 ENCOUNTER — Encounter: Payer: Self-pay | Admitting: Family Medicine

## 2018-12-18 ENCOUNTER — Other Ambulatory Visit: Payer: Self-pay

## 2018-12-18 VITALS — BP 132/80 | HR 84 | Temp 96.4°F | Ht 75.0 in | Wt 311.4 lb

## 2018-12-18 DIAGNOSIS — L989 Disorder of the skin and subcutaneous tissue, unspecified: Secondary | ICD-10-CM | POA: Diagnosis not present

## 2018-12-18 NOTE — Progress Notes (Signed)
Subjective:    Patient ID: Todd Willis, male    DOB: 1970-12-28, 48 y.o.   MRN: [id number]  HPI   Patient presents to clinic due to lesion/bump on back of scalp.  Patient noticed to a few weeks ago, tried to get in with his dermatologist however they did not have appointment until January.  Hoping he can get a sooner dermatology appointment with a new referral from [place].  Patient states now since he has noticed this area on the backside of head he finds himself rubbing it.  Area does not seem to bleed, and is not painful, states it is itchy at times.  Patient Active Problem List   Diagnosis Date Noted  . Prediabetes 04/26/2018  . Fatigue 10/22/2017  . Hypertension 09/01/2011  . Hyperlipidemia 09/01/2011  . Screening for prostate cancer 09/01/2011  . Anxiety 09/01/2011  . GERD (gastroesophageal reflux disease) 09/01/2011  . Obesity (BMI 30-39.9) 09/01/2011   Social History   Tobacco Use  . Smoking status: Never Smoker  . Smokeless tobacco: Never Used  Substance Use Topics  . Alcohol use: No   Review of Systems  Constitutional: Negative for chills, fatigue and fever.  HENT: Negative for congestion, ear pain, sinus pain and sore throat.   Eyes: Negative.   Respiratory: Negative for cough, shortness of breath and wheezing.   Cardiovascular: Negative for chest pain, palpitations and leg swelling.  Gastrointestinal: Negative for abdominal pain, diarrhea, nausea and vomiting.  Genitourinary: Negative for dysuria, frequency and urgency.  Musculoskeletal: Negative for arthralgias and myalgias.  Skin: ?lesion back of head Neurological: Negative for syncope, light-headedness and headaches.  Psychiatric/Behavioral: The patient is not nervous/anxious.       Objective:   Physical Exam Vitals signs and nursing note reviewed.  Constitutional:      General: He is not in acute distress.    Appearance: He is not ill-appearing, toxic-appearing or diaphoretic.  HENT:     Head:  Normocephalic and atraumatic.      Comments: Lesion location represented by red mark on diagram.  It is approximately the size of a pencil eraser.  It is normal skin color when compared to rest of patient's skin.  It is raised.  Appears almost wartlike and or similar to a seborrheic keratosis.  It is not bleeding. Cardiovascular:     Rate and Rhythm: Normal rate and regular rhythm.     Heart sounds: Normal heart sounds.  Pulmonary:     Effort: Pulmonary effort is normal. No respiratory distress.     Breath sounds: Normal breath sounds.  Skin:    General: Skin is warm and dry.     Findings: Lesion (back of left scalp (see diagram)) present.  Neurological:     Mental Status: He is alert and oriented to person, place, and time.     Gait: Gait normal.  Psychiatric:        Mood and Affect: Mood normal.        Behavior: Behavior normal.     Today's Vitals   12/18/18 0846  BP: 132/80  Pulse: 84  Temp: (!) 96.4 F (35.8 C)  TempSrc: Temporal  SpO2: 98%  Weight: (!) 311 lb 6.4 oz (141.3 kg)  Height: 6\' 3"  (1.905 m)   Body mass index is 38.92 kg/m.     Assessment & Plan:    Skin lesion on scalp-we will put in new referral to dermatology to hopefully get patient in sooner than January.  Area looks similar to  a seborrheic keratosis.  Advised if he has some itching, he can apply topical hydrocortisone 1% or Benadryl cream as needed.  Advised patient to try and avoid scratching area if possible.  Aware that son will contact him in regards to referral.  Advised to call office anytime with questions or concerns

## 2019-03-27 DIAGNOSIS — D2261 Melanocytic nevi of right upper limb, including shoulder: Secondary | ICD-10-CM | POA: Diagnosis not present

## 2019-03-27 DIAGNOSIS — D2262 Melanocytic nevi of left upper limb, including shoulder: Secondary | ICD-10-CM | POA: Diagnosis not present

## 2019-03-27 DIAGNOSIS — D225 Melanocytic nevi of trunk: Secondary | ICD-10-CM | POA: Diagnosis not present

## 2019-03-27 DIAGNOSIS — L821 Other seborrheic keratosis: Secondary | ICD-10-CM | POA: Diagnosis not present

## 2019-03-27 DIAGNOSIS — D485 Neoplasm of uncertain behavior of skin: Secondary | ICD-10-CM | POA: Diagnosis not present

## 2019-03-27 DIAGNOSIS — L820 Inflamed seborrheic keratosis: Secondary | ICD-10-CM | POA: Diagnosis not present

## 2019-04-03 ENCOUNTER — Telehealth: Payer: Self-pay | Admitting: Family

## 2019-04-03 ENCOUNTER — Other Ambulatory Visit: Payer: Self-pay

## 2019-04-03 ENCOUNTER — Encounter: Payer: Self-pay | Admitting: Family

## 2019-04-03 DIAGNOSIS — E785 Hyperlipidemia, unspecified: Secondary | ICD-10-CM

## 2019-04-03 DIAGNOSIS — I10 Essential (primary) hypertension: Secondary | ICD-10-CM

## 2019-04-03 DIAGNOSIS — K219 Gastro-esophageal reflux disease without esophagitis: Secondary | ICD-10-CM

## 2019-04-03 MED ORDER — LOSARTAN POTASSIUM-HCTZ 50-12.5 MG PO TABS: 1.0000 | ORAL_TABLET | Freq: Every day | ORAL | 0 refills | Status: DC

## 2019-04-03 MED ORDER — ROSUVASTATIN CALCIUM 10 MG PO TABS: ORAL_TABLET | ORAL | 0 refills | Status: DC

## 2019-04-03 MED ORDER — PANTOPRAZOLE SODIUM 40 MG PO TBEC: DELAYED_RELEASE_TABLET | ORAL | 0 refills | Status: DC

## 2019-04-03 NOTE — Telephone Encounter (Signed)
That is perfectly fine to push out. His BP has been well controlled in the past. Advice him to check at home a call us if > 120/80 consistently.  He is due for a CMP lab since on BP meds. Can order and have him come in for this lab in the next couple of weeks? Otherwise sch fu in 3 months for BP management .

## 2019-04-03 NOTE — Telephone Encounter (Signed)
Pt has an appt scheduled for Friday 1/22. Pt states that he needs a refill on his blood pressure med but can not afford to have a virtual visit because he is on a high deductible. Pt was upset but decided to keep his appt but would like a call back about why he needs to see provider every 6 months. Please advise prior to appt on whether he actually needs to be seen or not. Thanks

## 2019-04-03 NOTE — Telephone Encounter (Signed)
Left detailed messaged & asked him to call back to schedule appointment in 3 months then I can cancel appointment for 1/22. I also asked that he scheduled lab in the next few weeks.

## 2019-04-11 ENCOUNTER — Encounter: Payer: Self-pay | Admitting: Family

## 2019-04-11 ENCOUNTER — Ambulatory Visit (INDEPENDENT_AMBULATORY_CARE_PROVIDER_SITE_OTHER): Payer: BC Managed Care – PPO | Admitting: Family

## 2019-04-11 VITALS — BP 132/82 | HR 89 | Temp 97.7°F | Ht 75.0 in | Wt 312.2 lb

## 2019-04-11 DIAGNOSIS — Z125 Encounter for screening for malignant neoplasm of prostate: Secondary | ICD-10-CM

## 2019-04-11 DIAGNOSIS — K219 Gastro-esophageal reflux disease without esophagitis: Secondary | ICD-10-CM | POA: Diagnosis not present

## 2019-04-11 DIAGNOSIS — R7303 Prediabetes: Secondary | ICD-10-CM

## 2019-04-11 DIAGNOSIS — E785 Hyperlipidemia, unspecified: Secondary | ICD-10-CM

## 2019-04-11 DIAGNOSIS — I10 Essential (primary) hypertension: Secondary | ICD-10-CM | POA: Diagnosis not present

## 2019-04-11 LAB — COMPREHENSIVE METABOLIC PANEL
ALT: 47 U/L (ref 0–53)
AST: 35 U/L (ref 0–37)
Albumin: 4.4 g/dL (ref 3.5–5.2)
Alkaline Phosphatase: 57 U/L (ref 39–117)
BUN: 20 mg/dL (ref 6–23)
CO2: 28 mEq/L (ref 19–32)
Calcium: 9.6 mg/dL (ref 8.4–10.5)
Chloride: 102 mEq/L (ref 96–112)
Creatinine, Ser: 1.04 mg/dL (ref 0.40–1.50)
GFR: 76.05 mL/min (ref >60.00)
Glucose, Bld: 134 mg/dL — ABNORMAL HIGH (ref 70–99)
Potassium: 4.3 mEq/L (ref 3.5–5.1)
Sodium: 140 mEq/L (ref 135–145)
Total Bilirubin: 1 mg/dL (ref 0.2–1.2)
Total Protein: 6.8 g/dL (ref 6.0–8.3)

## 2019-04-11 LAB — CBC WITH DIFFERENTIAL/PLATELET
Basophils Absolute: 0 10*3/uL (ref 0.0–0.1)
Basophils Relative: 0.5 % (ref 0.0–3.0)
Eosinophils Absolute: 0.2 10*3/uL (ref 0.0–0.7)
Eosinophils Relative: 2.7 % (ref 0.0–5.0)
HCT: 44.5 % (ref 39.0–52.0)
Hemoglobin: 15.3 g/dL (ref 13.0–17.0)
Lymphocytes Relative: 23.4 % (ref 12.0–46.0)
Lymphs Abs: 1.6 10*3/uL (ref 0.7–4.0)
MCHC: 34.3 g/dL (ref 30.0–36.0)
MCV: 83.5 fl (ref 78.0–100.0)
Monocytes Absolute: 0.5 10*3/uL (ref 0.1–1.0)
Monocytes Relative: 7.1 % (ref 3.0–12.0)
Neutro Abs: 4.6 10*3/uL (ref 1.4–7.7)
Neutrophils Relative %: 66.3 % (ref 43.0–77.0)
Platelets: 207 10*3/uL (ref 150.0–400.0)
RBC: 5.33 Mil/uL (ref 4.22–5.81)
RDW: 13.9 % (ref 11.5–15.5)
WBC: 6.9 10*3/uL (ref 4.0–10.5)

## 2019-04-11 LAB — LIPID PANEL
Cholesterol: 140 mg/dL (ref 0–200)
HDL: 34.1 mg/dL — ABNORMAL LOW (ref >39.00)
LDL Cholesterol: 87 mg/dL (ref 0–99)
NonHDL: 105.65
Total CHOL/HDL Ratio: 4
Triglycerides: 94 mg/dL (ref 0.0–149.0)
VLDL: 18.8 mg/dL (ref 0.0–40.0)

## 2019-04-11 LAB — PSA: PSA: 0.47 ng/mL (ref 0.10–4.00)

## 2019-04-11 LAB — HEMOGLOBIN A1C: Hgb A1c MFr Bld: 6.5 % (ref 4.6–6.5)

## 2019-04-11 NOTE — Assessment & Plan Note (Signed)
Discussed with patient new guidelines for reaching blood pressure less than 120/80.  He politely declines adding on small dose of amlodipine at this visit.  He would like to focus on weight loss.  Advised him to give [place] a call blood pressures were to elevate in any way or certainly if weight loss doesn't result in  improved blood pressures.  Follow-up in 6 months. Note: We discussed relevant and past history of abnormal labs today at length, we decided to order appropriate labs for screening today based on this discussion. Patient was comfortable with this. Patient was advised if any symptoms were to change after today's visit, to notify me as we may always order additional labs.

## 2019-04-11 NOTE — Assessment & Plan Note (Signed)
Pending a1c. 

## 2019-04-11 NOTE — Assessment & Plan Note (Signed)
Symptoms controlled on Protonix.will continue

## 2019-04-11 NOTE — Progress Notes (Signed)
Subjective:    Patient ID: Todd Willis, male    DOB: [birth year], 49 y.o.   MRN: [id number]  CC: [name] is a 49 y.o. male who presents today for follow up.   HPI: Follow-up for hypertension, hyperlipidemia. Has been compliant with medications.  At home, blood pressure a bit labile taking 139/95, 125/90.  He is exercising about 5 days a week on the elliptical.  He denies any chest pain, shortness of breath, palpitations.  He would like to lose weight.   Uses discretion with salt.   GERD-no breakthrough symptoms.  Typically triggered by tomato-based, [place] dishes.  Feels well on Protonix.  No trouble swallowing or pain with swallowing  LDL 10/2018 < 100.  HISTORY:  Past Medical History:  Diagnosis Date  . Anxiety    on buspar and xanax in past  . Diverticulitis   . GERD (gastroesophageal reflux disease)    uses zegrid occas  . H/O cardiovascular stress test    normal per pt  . History of MRI of brain and brain stem    Normal per pt   History reviewed. No pertinent surgical history. Family History  Problem Relation Age of Onset  . Hypertension Mother   . Aneurysm Mother   . Hyperlipidemia Father   . Heart disease Father   . Stroke Father 64  . Hypertension Father   . Diabetes Father   . Heart disease Maternal Grandmother   . Hypertension Maternal Grandmother   . Depression Maternal Grandmother   . Aneurysm Maternal Grandmother   . Heart disease Maternal Grandfather   . Hypertension Maternal Grandfather   . Depression Maternal Grandfather   . Aneurysm Maternal Grandfather   . Stroke Paternal Grandfather 43    Allergies: Patient has no known allergies. Current Outpatient Medications on File Prior to Visit  Medication Sig Dispense Refill  . aspirin EC 81 MG tablet Take 1 tablet (81 mg total) by mouth daily. 30 tablet 11  . losartan-hydrochlorothiazide (HYZAAR) 50-12.5 MG tablet Take 1 tablet by mouth daily. 90 tablet 0  . Multiple Vitamin (MULTIVITAMIN)  tablet Take 1 tablet by mouth daily.    . pantoprazole (PROTONIX) 40 MG tablet TAKE 1 TABLET(40 MG) BY MOUTH DAILY 90 tablet 0  . rosuvastatin (CRESTOR) 10 MG tablet TAKE 1 TABLET(10 MG) BY MOUTH DAILY 90 tablet 0   No current facility-administered medications on file prior to visit.    Social History   Tobacco Use  . Smoking status: Never Smoker  . Smokeless tobacco: Never Used  Substance Use Topics  . Alcohol use: No  . Drug use: No    Review of Systems    Objective:    BP 132/82   Pulse 89   Temp 97.7 F (36.5 C) (Temporal)   Ht 6\' 3"  (1.905 m)   Wt (!) 312 lb 3.2 oz (141.6 kg)   SpO2 98%   BMI 39.02 kg/m  BP Readings from Last 3 Encounters:  04/11/19 132/82  12/18/18 132/80  10/25/18 122/82   Wt Readings from Last 3 Encounters:  04/11/19 (!) 312 lb 3.2 oz (141.6 kg)  12/18/18 (!) 311 lb 6.4 oz (141.3 kg)  10/25/18 (!) 304 lb (137.9 kg)    Physical Exam     Assessment & Plan:   Problem List Items Addressed This Visit      Cardiovascular and Mediastinum   Hypertension - Primary    Discussed with patient new guidelines for reaching blood pressure less than 120/80.  He politely declines adding on small dose of amlodipine at this visit.  He would like to focus on weight loss.  Advised him to give [place] a call blood pressures were to elevate in any way or certainly if weight loss doesn't result in  improved blood pressures.  Follow-up in 6 months. Note: We discussed relevant and past history of abnormal labs today at length, we decided to order appropriate labs for screening today based on this discussion. Patient was comfortable with this. Patient was advised if any symptoms were to change after today's visit, to notify me as we may always order additional labs.       Relevant Orders   CBC with Differential/Platelet   Comprehensive metabolic panel   Lipid panel   PSA   Hemoglobin A1c     Digestive   GERD (gastroesophageal reflux disease)    Symptoms  controlled on Protonix.will continue        Other   Hyperlipidemia    Compliant with medication, will continue.  Pending lipid panel      Prediabetes    Pending a1c      Screening for prostate cancer    Pending psa          I am having Todd Willis "Todd Willis" maintain his multivitamin, aspirin EC, losartan-hydrochlorothiazide, pantoprazole, and rosuvastatin.   No orders of the defined types were placed in this encounter.   Return precautions given.   Risks, benefits, and alternatives of the medications and treatment plan prescribed today were discussed, and patient expressed understanding.   Education regarding symptom management and diagnosis given to patient on AVS.  Continue to follow with Allegra Grana, FNP for routine health maintenance.   Todd Willis and I agreed with plan.   Rennie Plowman, FNP

## 2019-04-11 NOTE — Patient Instructions (Signed)
Stay safe  Monitor blood pressure,  Goal is less than 120/80, based on newest guidelines; if persistently higher, please make sooner follow up appointment so we can recheck you blood pressure and manage medications   DASH Eating Plan DASH stands for "Dietary Approaches to Stop Hypertension." The DASH eating plan is a healthy eating plan that has been shown to reduce high blood pressure (hypertension). It may also reduce your risk for type 2 diabetes, heart disease, and stroke. The DASH eating plan may also help with weight loss. What are tips for following this plan?  General guidelines  Avoid eating more than 2,300 mg (milligrams) of salt (sodium) a day. If you have hypertension, you may need to reduce your sodium intake to 1,500 mg a day.  Limit alcohol intake to no more than 1 drink a day for nonpregnant women and 2 drinks a day for men. One drink equals 12 oz of beer, 5 oz of wine, or 1 oz of hard liquor.  Work with your health care provider to maintain a healthy body weight or to lose weight. Ask what an ideal weight is for you.  Get at least 30 minutes of exercise that causes your heart to beat faster (aerobic exercise) most days of the week. Activities may include walking, swimming, or biking.  Work with your health care provider or diet and nutrition specialist (dietitian) to adjust your eating plan to your individual calorie needs. Reading food labels   Check food labels for the amount of sodium per serving. Choose foods with less than 5 percent of the Daily Value of sodium. Generally, foods with less than 300 mg of sodium per serving fit into this eating plan.  To find whole grains, look for the word "whole" as the first word in the ingredient list. Shopping  Buy products labeled as "low-sodium" or "no salt added."  Buy fresh foods. Avoid canned foods and premade or frozen meals. Cooking  Avoid adding salt when cooking. Use salt-free seasonings or herbs instead of table  salt or sea salt. Check with your health care provider or pharmacist before using salt substitutes.  Do not fry foods. Cook foods using healthy methods such as baking, boiling, grilling, and broiling instead.  Cook with heart-healthy oils, such as olive, canola, soybean, or sunflower oil. Meal planning  Eat a balanced diet that includes: ? 5 or more servings of fruits and vegetables each day. At each meal, try to fill half of your plate with fruits and vegetables. ? Up to 6-8 servings of whole grains each day. ? Less than 6 oz of lean meat, poultry, or fish each day. A 3-oz serving of meat is about the same size as a deck of cards. One egg equals 1 oz. ? 2 servings of low-fat dairy each day. ? A serving of nuts, seeds, or beans 5 times each week. ? Heart-healthy fats. Healthy fats called Omega-3 fatty acids are found in foods such as flaxseeds and coldwater fish, like sardines, salmon, and mackerel.  Limit how much you eat of the following: ? Canned or prepackaged foods. ? Food that is high in trans fat, such as fried foods. ? Food that is high in saturated fat, such as fatty meat. ? Sweets, desserts, sugary drinks, and other foods with added sugar. ? Full-fat dairy products.  Do not salt foods before eating.  Try to eat at least 2 vegetarian meals each week.  Eat more home-cooked food and less restaurant, buffet, and fast food.  When eating at a restaurant, ask that your food be prepared with less salt or no salt, if possible. What foods are recommended? The items listed may not be a complete list. Talk with your dietitian about what dietary choices are best for you. Grains Whole-grain or whole-wheat bread. Whole-grain or whole-wheat pasta. Brown rice. [place]. Bulgur. Whole-grain and low-sodium cereals. Pita bread. Low-fat, low-sodium crackers. Whole-wheat flour tortillas. Vegetables Fresh or frozen vegetables (raw, steamed, roasted, or grilled). Low-sodium or  reduced-sodium tomato and vegetable juice. Low-sodium or reduced-sodium tomato sauce and tomato paste. Low-sodium or reduced-sodium canned vegetables. Fruits All fresh, dried, or frozen fruit. Canned fruit in natural juice (without added sugar). Meat and other protein foods Skinless chicken or [place]. Ground chicken or [place]. Pork with fat trimmed off. Fish and seafood. Egg whites. Dried beans, peas, or lentils. Unsalted nuts, nut butters, and seeds. Unsalted canned beans. Lean cuts of beef with fat trimmed off. Low-sodium, lean deli meat. Dairy Low-fat (1%) or fat-free (skim) milk. Fat-free, low-fat, or reduced-fat cheeses. Nonfat, low-sodium ricotta or cottage cheese. Low-fat or nonfat yogurt. Low-fat, low-sodium cheese. Fats and oils Soft margarine without trans fats. Vegetable oil. Low-fat, reduced-fat, or light mayonnaise and salad dressings (reduced-sodium). Canola, safflower, olive, soybean, and sunflower oils. Avocado. Seasoning and other foods Herbs. Spices. Seasoning mixes without salt. Unsalted popcorn and pretzels. Fat-free sweets. What foods are not recommended? The items listed may not be a complete list. Talk with your dietitian about what dietary choices are best for you. Grains Baked goods made with fat, such as croissants, muffins, or some breads. Dry pasta or rice meal packs. Vegetables Creamed or fried vegetables. Vegetables in a cheese sauce. Regular canned vegetables (not low-sodium or reduced-sodium). Regular canned tomato sauce and paste (not low-sodium or reduced-sodium). Regular tomato and vegetable juice (not low-sodium or reduced-sodium). Angie Fava. Olives. Fruits Canned fruit in a light or heavy syrup. Fried fruit. Fruit in cream or butter sauce. Meat and other protein foods Fatty cuts of meat. Ribs. Fried meat. Berniece Salines. Sausage. Bologna and other processed lunch meats. Salami. Fatback. Hotdogs. Bratwurst. Salted nuts and seeds. Canned beans with added salt. Canned or  smoked fish. Whole eggs or egg yolks. Chicken or [place] with skin. Dairy Whole or 2% milk, cream, and half-and-half. Whole or full-fat cream cheese. Whole-fat or sweetened yogurt. Full-fat cheese. Nondairy creamers. Whipped toppings. Processed cheese and cheese spreads. Fats and oils Butter. Stick margarine. Lard. Shortening. Ghee. Bacon fat. Tropical oils, such as coconut, palm kernel, or palm oil. Seasoning and other foods Salted popcorn and pretzels. Onion salt, garlic salt, seasoned salt, table salt, and sea salt. Worcestershire sauce. Tartar sauce. Barbecue sauce. Teriyaki sauce. Soy sauce, including reduced-sodium. Steak sauce. Canned and packaged gravies. Fish sauce. Oyster sauce. Cocktail sauce. Horseradish that you find on the shelf. Ketchup. Mustard. Meat flavorings and tenderizers. Bouillon cubes. Hot sauce and Tabasco sauce. Premade or packaged marinades. Premade or packaged taco seasonings. Relishes. Regular salad dressings. Where to find more information:  National Heart, Lung, and [place]: https://wilson-eaton.com/  American Heart Association: www.heart.org Summary  The DASH eating plan is a healthy eating plan that has been shown to reduce high blood pressure (hypertension). It may also reduce your risk for type 2 diabetes, heart disease, and stroke.  With the DASH eating plan, you should limit salt (sodium) intake to 2,300 mg a day. If you have hypertension, you may need to reduce your sodium intake to 1,500 mg a day.  When on the DASH eating plan,  aim to eat more fresh fruits and vegetables, whole grains, lean proteins, low-fat dairy, and heart-healthy fats.  Work with your health care provider or diet and nutrition specialist (dietitian) to adjust your eating plan to your individual calorie needs. This information is not intended to replace advice given to you by your health care provider. Make sure you discuss any questions you have with your health care provider. Document  Revised: 02/16/2017 Document Reviewed: 02/28/2016 Elsevier Patient Education  2020 [employer].  Managing Your Hypertension Hypertension is commonly called high blood pressure. This is when the force of your blood pressing against the walls of your arteries is too strong. Arteries are blood vessels that carry blood from your heart throughout your body. Hypertension forces the heart to work harder to pump blood, and may cause the arteries to become narrow or stiff. Having untreated or uncontrolled hypertension can cause heart attack, stroke, kidney disease, and other problems. What are blood pressure readings? A blood pressure reading consists of a higher number over a lower number. Ideally, your blood pressure should be below 120/80. The first ("top") number is called the systolic pressure. It is a measure of the pressure in your arteries as your heart beats. The second ("bottom") number is called the diastolic pressure. It is a measure of the pressure in your arteries as the heart relaxes. What does my blood pressure reading mean? Blood pressure is classified into four stages. Based on your blood pressure reading, your health care provider may use the following stages to determine what type of treatment you need, if any. Systolic pressure and diastolic pressure are measured in a unit called mm Hg. Normal  Systolic pressure: below 120.  Diastolic pressure: below 80. Elevated  Systolic pressure: 120-129.  Diastolic pressure: below 80. Hypertension stage 1  Systolic pressure: 130-139.  Diastolic pressure: 80-89. Hypertension stage 2  Systolic pressure: 140 or above.  Diastolic pressure: 90 or above. What health risks are associated with hypertension? Managing your hypertension is an important responsibility. Uncontrolled hypertension can lead to:  A heart attack.  A stroke.  A weakened blood vessel (aneurysm).  Heart failure.  Kidney damage.  Eye damage.  Metabolic  syndrome.  Memory and concentration problems. What changes can I make to manage my hypertension? Hypertension can be managed by making lifestyle changes and possibly by taking medicines. Your health care provider will help you make a plan to bring your blood pressure within a normal range. Eating and drinking   Eat a diet that is high in fiber and potassium, and low in salt (sodium), added sugar, and fat. An example eating plan is called the DASH (Dietary Approaches to Stop Hypertension) diet. To eat this way: ? Eat plenty of fresh fruits and vegetables. Try to fill half of your plate at each meal with fruits and vegetables. ? Eat whole grains, such as whole wheat pasta, brown rice, or whole grain bread. Fill about one quarter of your plate with whole grains. ? Eat low-fat diary products. ? Avoid fatty cuts of meat, processed or cured meats, and poultry with skin. Fill about one quarter of your plate with lean proteins such as fish, chicken without skin, beans, eggs, and tofu. ? Avoid premade and processed foods. These tend to be higher in sodium, added sugar, and fat.  Reduce your daily sodium intake. Most people with hypertension should eat less than 1,500 mg of sodium a day.  Limit alcohol intake to no more than 1 drink a day for  nonpregnant women and 2 drinks a day for men. One drink equals 12 oz of beer, 5 oz of wine, or 1 oz of hard liquor. Lifestyle  Work with your health care provider to maintain a healthy body weight, or to lose weight. Ask what an ideal weight is for you.  Get at least 30 minutes of exercise that causes your heart to beat faster (aerobic exercise) most days of the week. Activities may include walking, swimming, or biking.  Include exercise to strengthen your muscles (resistance exercise), such as weight lifting, as part of your weekly exercise routine. Try to do these types of exercises for 30 minutes at least 3 days a week.  Do not use any products that contain  nicotine or tobacco, such as cigarettes and e-cigarettes. If you need help quitting, ask your health care provider.  Control any long-term (chronic) conditions you have, such as high cholesterol or diabetes. Monitoring  Monitor your blood pressure at home as told by your health care provider. Your personal target blood pressure may vary depending on your medical conditions, your age, and other factors.  Have your blood pressure checked regularly, as often as told by your health care provider. Working with your health care provider  Review all the medicines you take with your health care provider because there may be side effects or interactions.  Talk with your health care provider about your diet, exercise habits, and other lifestyle factors that may be contributing to hypertension.  Visit your health care provider regularly. Your health care provider can help you create and adjust your plan for managing hypertension. Will I need medicine to control my blood pressure? Your health care provider may prescribe medicine if lifestyle changes are not enough to get your blood pressure under control, and if:  Your systolic blood pressure is 130 or higher.  Your diastolic blood pressure is 80 or higher. Take medicines only as told by your health care provider. Follow the directions carefully. Blood pressure medicines must be taken as prescribed. The medicine does not work as well when you skip doses. Skipping doses also puts you at risk for problems. Contact a health care provider if:  You think you are having a reaction to medicines you have taken.  You have repeated (recurrent) headaches.  You feel dizzy.  You have swelling in your ankles.  You have trouble with your vision. Get help right away if:  You develop a severe headache or confusion.  You have unusual weakness or numbness, or you feel faint.  You have severe pain in your chest or abdomen.  You vomit repeatedly.  You have  trouble breathing. Summary  Hypertension is when the force of blood pumping through your arteries is too strong. If this condition is not controlled, it may put you at risk for serious complications.  Your personal target blood pressure may vary depending on your medical conditions, your age, and other factors. For most people, a normal blood pressure is less than 120/80.  Hypertension is managed by lifestyle changes, medicines, or both. Lifestyle changes include weight loss, eating a healthy, low-sodium diet, exercising more, and limiting alcohol. This information is not intended to replace advice given to you by your health care provider. Make sure you discuss any questions you have with your health care provider. Document Revised: 06/28/2018 Document Reviewed: 02/02/2016 Elsevier Patient Education  [place].

## 2019-04-11 NOTE — Assessment & Plan Note (Signed)
Pending psa 

## 2019-04-11 NOTE — Assessment & Plan Note (Signed)
Compliant with medication, will continue.  Pending lipid panel

## 2019-04-15 ENCOUNTER — Encounter: Payer: Self-pay | Admitting: Family

## 2019-07-28 ENCOUNTER — Other Ambulatory Visit: Payer: Self-pay | Admitting: Family

## 2019-07-28 ENCOUNTER — Ambulatory Visit: Payer: BC Managed Care – PPO | Admitting: Family

## 2019-07-28 DIAGNOSIS — K219 Gastro-esophageal reflux disease without esophagitis: Secondary | ICD-10-CM

## 2019-07-29 ENCOUNTER — Other Ambulatory Visit: Payer: Self-pay

## 2019-07-29 ENCOUNTER — Encounter: Payer: Self-pay | Admitting: Family

## 2019-07-29 ENCOUNTER — Ambulatory Visit (INDEPENDENT_AMBULATORY_CARE_PROVIDER_SITE_OTHER): Payer: BC Managed Care – PPO | Admitting: Family

## 2019-07-29 VITALS — BP 140/92 | HR 94 | Temp 97.0°F | Ht 75.0 in | Wt 303.1 lb

## 2019-07-29 DIAGNOSIS — R7303 Prediabetes: Secondary | ICD-10-CM

## 2019-07-29 DIAGNOSIS — I10 Essential (primary) hypertension: Secondary | ICD-10-CM

## 2019-07-29 DIAGNOSIS — E785 Hyperlipidemia, unspecified: Secondary | ICD-10-CM

## 2019-07-29 DIAGNOSIS — H539 Unspecified visual disturbance: Secondary | ICD-10-CM | POA: Diagnosis not present

## 2019-07-29 MED ORDER — METFORMIN HCL ER 500 MG PO TB24: ORAL_TABLET | ORAL | 3 refills | Status: DC

## 2019-07-29 MED ORDER — ROSUVASTATIN CALCIUM 5 MG PO TABS: 5.0000 mg | ORAL_TABLET | Freq: Every day | ORAL | 3 refills | Status: DC

## 2019-07-29 MED ORDER — LOSARTAN POTASSIUM 100 MG PO TABS: 100.0000 mg | ORAL_TABLET | Freq: Every day | ORAL | 3 refills | Status: DC

## 2019-07-29 MED ORDER — METFORMIN HCL ER (MOD) 500 MG PO TB24: 500.0000 mg | ORAL_TABLET | Freq: Every evening | ORAL | 1 refills | Status: DC

## 2019-07-29 MED ORDER — HYDROCHLOROTHIAZIDE 12.5 MG PO CAPS: 12.5000 mg | ORAL_CAPSULE | Freq: Every day | ORAL | 1 refills | Status: DC

## 2019-07-29 NOTE — Progress Notes (Signed)
Subjective:    Patient ID: Todd Willis, male    DOB: March 06, 1971, 49 y.o.   MRN: [id number]  CC: Todd Willis is a 49 y.o. male who presents today for follow up.   HPI: Complains of bilateral vision changes , describes as 'kaleidsocope'. Sees flashing colors in the periphery.  Had 2 episode in 2 months ago, resolved on its own in 20 minutes. Had been looking at the computer at this time. Follows with [employer]. Prescribed new prescription glasses.  Seen by eye doctor 2-3 weeks ago.  Notes last week, has 3 episodes which were self limited. One episode triggered by reflection of sun off pool water.  Denies HA when vision changes. 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  Endorses at baseline eyes being sensitive to light.  NO vomiting, fever, eye pain, eye discharge, tinnitus, vertigo, congestion, chest pain or pressure, numbness or tingling radiating to left arm or jaw, palpitations, dizziness,leg numbness, leg weakness, or shortness of breath.   Father at  Had stroke at 32 yo.   HTN- increase of blood pressure of late. At home 140/99.  Denies exertional    DM-   HLD-increased crestor 10 mg QD, didn't increase. ?  Due colonscopy  MR MRA Brain w/o constrast- no acute findings. Unremarkable MR angiography.  HISTORY:  Past Medical History:  Diagnosis Date  . Anxiety    on buspar and xanax in past  . Diverticulitis   . GERD (gastroesophageal reflux disease)    uses zegrid occas  . H/O cardiovascular stress test    normal per pt  . History of MRI of brain and brain stem    Normal per pt   History reviewed. No pertinent surgical history. Family History  Problem Relation Age of Onset  . Hypertension Mother   . Aneurysm Mother   . Hyperlipidemia Father   . Heart disease Father   . Stroke Father 66  . Hypertension Father   . Diabetes Father   . Heart disease Maternal Grandmother   . Hypertension Maternal  Grandmother   . Depression Maternal Grandmother   . Aneurysm Maternal Grandmother   . Heart disease Maternal Grandfather   . Hypertension Maternal Grandfather   . Depression Maternal Grandfather   . Aneurysm Maternal Grandfather   . Stroke Paternal Grandfather 7    Allergies: Patient has no known allergies. Current Outpatient Medications on File Prior to Visit  Medication Sig Dispense Refill  . aspirin EC 81 MG tablet Take 1 tablet (81 mg total) by mouth daily. 30 tablet 11  . Multiple Vitamin (MULTIVITAMIN) tablet Take 1 tablet by mouth daily.    . pantoprazole (PROTONIX) 40 MG tablet TAKE 1 TABLET(40 MG) BY MOUTH DAILY. TAKE ON EMPTY STOMACH 90 tablet 0  . rosuvastatin (CRESTOR) 10 MG tablet TAKE 1 TABLET(10 MG) BY MOUTH DAILY 90 tablet 0  . neomycin-polymyxin b-dexamethasone (MAXITROL) 3.5-10000-0.1 SUSP 1 drop 4 (four) times daily.     No current facility-administered medications on file prior to visit.    Social History   Tobacco Use  . Smoking status: Never Smoker  . Smokeless tobacco: Never Used  Substance Use Topics  . Alcohol use: No  . Drug use: No    Review of Systems  Constitutional: Negative for chills and fever.  Eyes: Positive for photophobia and visual disturbance. Negative for pain, discharge, redness and itching.  Respiratory: Negative for cough.   Cardiovascular:  Negative for chest pain and palpitations.  Gastrointestinal: Negative for nausea and vomiting.      Objective:    BP (!) 140/92   Pulse 94   Temp (!) 97 F (36.1 C) (Temporal)   Ht 6\' 3"  (1.905 m)   Wt (!) 303 lb 1.9 oz (137.5 kg)   SpO2 98%   BMI 37.89 kg/m  BP Readings from Last 3 Encounters:  07/29/19 (!) 140/92  04/11/19 132/82  12/18/18 132/80   Wt Readings from Last 3 Encounters:  07/29/19 (!) 303 lb 1.9 oz (137.5 kg)  04/11/19 (!) 312 lb 3.2 oz (141.6 kg)  12/18/18 (!) 311 lb 6.4 oz (141.3 kg)    Physical Exam Vitals reviewed.  Constitutional:      Appearance: He is  well-developed.  HENT:     Right Ear: Hearing normal.     Left Ear: Hearing normal.     Mouth/Throat:     Pharynx: Uvula midline. No posterior oropharyngeal erythema.  Eyes:     General: Lids are normal. Lids are everted, no foreign bodies appreciated.     Conjunctiva/sclera: Conjunctivae normal.     Pupils: Pupils are equal, round, and reactive to light.     Comments: Normal fundus bilaterally.  Cardiovascular:     Rate and Rhythm: Regular rhythm.     Heart sounds: Normal heart sounds.  Pulmonary:     Effort: Pulmonary effort is normal. No respiratory distress.     Breath sounds: Normal breath sounds. No wheezing, rhonchi or rales.  Lymphadenopathy:     Head:     Right side of head: No submental, submandibular, tonsillar, preauricular, posterior auricular or occipital adenopathy.     Left side of head: No submental, submandibular, tonsillar, preauricular, posterior auricular or occipital adenopathy.     Cervical: No cervical adenopathy.  Skin:    General: Skin is warm and dry.  Neurological:     Mental Status: He is alert.     Cranial Nerves: No cranial nerve deficit.     Sensory: No sensory deficit.     Deep Tendon Reflexes:     Reflex Scores:      Bicep reflexes are 2+ on the right side and 2+ on the left side.      Patellar reflexes are 2+ on the right side and 2+ on the left side.    Comments: Grip equal and strong bilateral upper extremities. Gait strong and steady. Able to perform rapid alternating movement without difficulty.  Psychiatric:        Speech: Speech normal.        Behavior: Behavior normal.        Assessment & Plan:   Problem List Items Addressed This Visit      Cardiovascular and Mediastinum   Hypertension - Primary    Elevated.  Discussed goal of less than 120/80.  Will increase losartan to 100mg , keep hydrochlorothiazide the same, 12.5mg .      Relevant Medications   losartan (COZAAR) 100 MG tablet   hydrochlorothiazide (MICROZIDE) 12.5 MG  capsule   rosuvastatin (CRESTOR) 5 MG tablet     Endocrine   Diabetes mellitus without complication (HCC)    Start metformin.       Relevant Medications   losartan (COZAAR) 100 MG tablet   rosuvastatin (CRESTOR) 5 MG tablet   metFORMIN (GLUCOPHAGE-XR) 500 MG 24 hr tablet     Other   Hyperlipidemia    Discussed goal of LDL being less than 70 particular in  the setting of diabetes.  Patient was very understanding of this.  We agreed to slightly to increase Crestor dose.  Patient will start at 15 mg and try to take this at least once a week, other days he might just take 10 mg.  He will let me know how its going.      Relevant Medications   losartan (COZAAR) 100 MG tablet   hydrochlorothiazide (MICROZIDE) 12.5 MG capsule   rosuvastatin (CRESTOR) 5 MG tablet   Vision changes    Reassured by neurologic exam. However concerned as episodes are more frequent, self-limiting.  Denies headache, vertigo, nausea vomiting or fever.  MRI, MRA normal of the brain in 2013.  Patient declines repeat MRI MRI of the brain.  recent eye exam and I did advise him to go ahead and pick up his new eyeglasses.  Discussed differentials including fatigue, optic migraine, elevated blood pressure.  Pending consult with neurology, Dr. Epimenio Foot      Relevant Orders   Ambulatory referral to Neurology       I have discontinued Joslyn Devon "[name]"'s losartan-hydrochlorothiazide and metFORMIN. I am also having him start on losartan, hydrochlorothiazide, rosuvastatin, and metFORMIN. Additionally, I am having him maintain his multivitamin, aspirin EC, rosuvastatin, pantoprazole, and neomycin-polymyxin b-dexamethasone.   Meds ordered this encounter  Medications  . losartan (COZAAR) 100 MG tablet    Sig: Take 1 tablet (100 mg total) by mouth daily.    Dispense:  90 tablet    Refill:  3    Order Specific Question:   Supervising Provider    Answer:   Duncan Dull L [[id number]]  . hydrochlorothiazide (MICROZIDE) 12.5 MG  capsule    Sig: Take 1 capsule (12.5 mg total) by mouth daily.    Dispense:  90 capsule    Refill:  1    Order Specific Question:   Supervising Provider    Answer:   Darrick Huntsman, TERESA L [[id number]]  . rosuvastatin (CRESTOR) 5 MG tablet    Sig: Take 1 tablet (5 mg total) by mouth daily.    Dispense:  90 tablet    Refill:  3    Order Specific Question:   Supervising Provider    Answer:   Duncan Dull L [[id number]]  . DISCONTD: metFORMIN (GLUMETZA) 500 MG (MOD) 24 hr tablet    Sig: Take 1 tablet (500 mg total) by mouth every evening. Take in evening.    Dispense:  90 tablet    Refill:  1    Order Specific Question:   Supervising Provider    Answer:   Duncan Dull L [[id number]]  . metFORMIN (GLUCOPHAGE-XR) 500 MG 24 hr tablet    Sig: Take one tablet (500mg ) daily in the evening.    Dispense:  30 tablet    Refill:  3    Return precautions given.   Risks, benefits, and alternatives of the medications and treatment plan prescribed today were discussed, and patient expressed understanding.   Education regarding symptom management and diagnosis given to patient on AVS.  Continue to follow with , FNP for routine health maintenance.   Allegra Grana and I agreed with plan.   Joslyn Devon, FNP

## 2019-07-29 NOTE — Assessment & Plan Note (Addendum)
Reassured by neurologic exam. However concerned as episodes are more frequent, self-limiting.  Denies headache, vertigo, nausea vomiting or fever.  MRI, MRA normal of the brain in 2013.  Patient declines repeat MRI MRI of the brain.  recent eye exam and I did advise him to go ahead and pick up his new eyeglasses.  Discussed differentials including fatigue, optic migraine, elevated blood pressure.  Pending consult with neurology, Dr. Epimenio Foot

## 2019-07-29 NOTE — Patient Instructions (Addendum)
Start losartan 100mg   Continue hctz   Increase crestor to 15mg  and let me know how you are.   Referral to neurology, Dr . Let know if you dont hear back within a week in regards to an appointment being scheduled.    As discussed, any changes in your vision complaint or development of new symptoms, please let me know immediately

## 2019-07-29 NOTE — Assessment & Plan Note (Signed)
Start metformin.

## 2019-07-29 NOTE — Assessment & Plan Note (Signed)
Discussed goal of LDL being less than 70 particular in the setting of diabetes.  Patient was very understanding of this.  We agreed to slightly to increase Crestor dose.  Patient will start at 15 mg and try to take this at least once a week, other days he might just take 10 mg.  He will let me know how its going.

## 2019-07-29 NOTE — Assessment & Plan Note (Signed)
Elevated.  Discussed goal of less than 120/80.  Will increase losartan to 100mg , keep hydrochlorothiazide the same, 12.5mg .

## 2019-07-30 ENCOUNTER — Other Ambulatory Visit: Payer: Self-pay | Admitting: Family

## 2019-08-05 ENCOUNTER — Other Ambulatory Visit: Payer: Self-pay

## 2019-08-05 ENCOUNTER — Other Ambulatory Visit (INDEPENDENT_AMBULATORY_CARE_PROVIDER_SITE_OTHER): Payer: BC Managed Care – PPO

## 2019-08-05 DIAGNOSIS — R7303 Prediabetes: Secondary | ICD-10-CM

## 2019-08-05 LAB — BASIC METABOLIC PANEL
BUN: 13 mg/dL (ref 6–23)
CO2: 30 mEq/L (ref 19–32)
Calcium: 9.2 mg/dL (ref 8.4–10.5)
Chloride: 104 mEq/L (ref 96–112)
Creatinine, Ser: 0.99 mg/dL (ref 0.40–1.50)
GFR: 80.39 mL/min (ref >60.00)
Glucose, Bld: 139 mg/dL — ABNORMAL HIGH (ref 70–99)
Potassium: 4.1 mEq/L (ref 3.5–5.1)
Sodium: 139 mEq/L (ref 135–145)

## 2019-08-05 LAB — HEMOGLOBIN A1C: Hgb A1c MFr Bld: 6.5 % (ref 4.6–6.5)

## 2019-09-10 ENCOUNTER — Encounter: Payer: Self-pay | Admitting: Family

## 2019-09-20 ENCOUNTER — Encounter: Payer: Self-pay | Admitting: Family

## 2019-09-30 ENCOUNTER — Encounter: Payer: Self-pay | Admitting: Neurology

## 2019-09-30 ENCOUNTER — Ambulatory Visit (INDEPENDENT_AMBULATORY_CARE_PROVIDER_SITE_OTHER): Payer: BC Managed Care – PPO | Admitting: Neurology

## 2019-09-30 VITALS — BP 150/94 | HR 104 | Ht 75.0 in | Wt 304.2 lb

## 2019-09-30 DIAGNOSIS — R0683 Snoring: Secondary | ICD-10-CM | POA: Diagnosis not present

## 2019-09-30 DIAGNOSIS — H539 Unspecified visual disturbance: Secondary | ICD-10-CM

## 2019-09-30 DIAGNOSIS — I10 Essential (primary) hypertension: Secondary | ICD-10-CM

## 2019-09-30 NOTE — Progress Notes (Signed)
[facility]  PATIENT: Todd Willis DOB: Apr 02, 1970  REFERRING DOCTOR OR PCP:  Rennie Plowman, FNP SOURCE: Patient, notes from primary care, imaging reports, MRI images personally reviewed  _________________________________   HISTORICAL  CHIEF COMPLAINT:  Chief Complaint  Patient presents with  . New Patient (Initial Visit)    RM 12, alone. Internal referral from Allegra Grana, FNP for vision changes. Pt reports history of auras. Starting in march, he started having these events more often. Saw eye doctor in March. They updated prescription but has not helped. He wears bifocals. Unable to do vision test with glasses.     HISTORY OF PRESENT ILLNESS:  I had the pleasure of seeing patient, Todd Willis, at [facility] for neurologic consultation regarding his transient episodes of visual disturbances.  He is a 49 year old man who has had occasional visual disturbance spells.   He had a couple episodes of visual changes, around age 73 and age 16.   In March, he had 2 episodes over 2 days lasting 15-20 minutes each..   He had 3 more spells in May, also lasting 15-20 minutes.     He has no nausea or vomiting.  Symptoms do not change with movements.   No associated photophobia or phonophobia.   The episodes start with twinkling lights with a mild kaleidoscope view.   Spells usually occur while on the computer (doing more due to remote work).   He has no change covering one eye or the other.    He has no intense headache but will sometimes have a dull ache afterwards.  Imaging the spells, he denies any visual disturbance.  He does note getting new glasses a few months ago.  He saw ophthalmology in March.  There was no intraocular disorder.  He has essential HTN, hyperlipidemia and mild DM.  Blood pressure has been more difficult to control with this year.   He takes bASA.   He snores at night but has not been told he has apnea.   He has gained 10  pounds this year.  His father had a CVA at age 62.   We discussed reduction of stroke risks.    EPWORTH SLEEPINESS SCALE  On a scale of 0 - 3 what is the chance of dozing:  Sitting and Reading:  1 Watching TV:   2 Sitting inactive in a public place: 0 Passenger in car for one hour: 0 Lying down to rest in the afternoon: 2 Sitting and talking to someone: 0 Sitting quietly after lunch:  0 In a car, stopped in traffic:  0  Total (out of 24):   5/24   Not excessively sleepy  I personally reviewed the imaging studies from 01/23/2012, the last time he had an MRI when he was experiencing a few spells.  MRI brain was normal.  Specifically there are no white matter changes.   MRA of the intracranial arteries was normal.  There were no aneurysms or definite stenosis noted    REVIEW OF SYSTEMS: Constitutional: No fevers, chills, sweats, or change in appetite.  He snores Eyes: No visual changes, double vision, eye pain Ear, nose and throat: No hearing loss, ear pain, nasal congestion, sore throat Cardiovascular: No chest pain, palpitations Respiratory: No shortness of breath at rest or with exertion.   No wheezes GastrointestinaI: No nausea, vomiting, diarrhea, abdominal pain, fecal incontinence Genitourinary: No dysuria, urinary retention or frequency.  No nocturia. Musculoskeletal: No neck pain, back pain Integumentary: No  rash, pruritus, skin lesions Neurological: as above Psychiatric: No depression at this time.  No anxiety Endocrine: No palpitations, diaphoresis, change in appetite, change in weigh or increased thirst Hematologic/Lymphatic: No anemia, purpura, petechiae. Allergic/Immunologic: No itchy/runny eyes, nasal congestion, recent allergic reactions, rashes  ALLERGIES: No Known Allergies  HOME MEDICATIONS:  Current Outpatient Medications:  .  aspirin EC 81 MG tablet, Take 1 tablet (81 mg total) by mouth daily., Disp: 30 tablet, Rfl: 11 .  hydrochlorothiazide (MICROZIDE)  12.5 MG capsule, Take 1 capsule (12.5 mg total) by mouth daily., Disp: 90 capsule, Rfl: 1 .  losartan (COZAAR) 100 MG tablet, Take 1 tablet (100 mg total) by mouth daily., Disp: 90 tablet, Rfl: 3 .  metFORMIN (GLUCOPHAGE-XR) 500 MG 24 hr tablet, Take one tablet (500mg ) daily in the evening., Disp: 30 tablet, Rfl: 3 .  Multiple Vitamin (MULTIVITAMIN) tablet, Take 1 tablet by mouth daily., Disp: , Rfl:  .  pantoprazole (PROTONIX) 40 MG tablet, TAKE 1 TABLET(40 MG) BY MOUTH DAILY. TAKE ON EMPTY STOMACH, Disp: 90 tablet, Rfl: 0 .  rosuvastatin (CRESTOR) 10 MG tablet, TAKE 1 TABLET(10 MG) BY MOUTH DAILY, Disp: 90 tablet, Rfl: 0  PAST MEDICAL HISTORY: Past Medical History:  Diagnosis Date  . Anxiety    on buspar and xanax in past  . Diabetes (HCC)   . Diverticulitis   . GERD (gastroesophageal reflux disease)    uses zegrid occas  . H/O cardiovascular stress test    normal per pt  . High cholesterol   . History of MRI of brain and brain stem    Normal per pt  . Hypertension     PAST SURGICAL HISTORY: Past Surgical History:  Procedure Laterality Date  . NO PAST SURGERIES      FAMILY HISTORY: Family History  Problem Relation Age of Onset  . Hypertension Mother   . Aneurysm Mother   . Hyperlipidemia Father   . Heart disease Father   . Stroke Father 64  . Hypertension Father   . Diabetes Father   . Heart disease Maternal Grandmother   . Hypertension Maternal Grandmother   . Depression Maternal Grandmother   . Aneurysm Maternal Grandmother   . Heart disease Maternal Grandfather   . Hypertension Maternal Grandfather   . Depression Maternal Grandfather   . Aneurysm Maternal Grandfather   . Stroke Paternal Grandfather 50    SOCIAL HISTORY:  Social History   Socioeconomic History  . Marital status: Married    Spouse name: Genie Mirabal  . Number of children: 2  . Years of education: BS  . Highest education level: Not on file  Occupational History  . Occupation:  [employer]  Tobacco Use  . Smoking status: Never Smoker  . Smokeless tobacco: Never Used  Substance and Sexual Activity  . Alcohol use: No  . Drug use: No  . Sexual activity: Not on file  Other Topics Concern  . Not on file  Social History Narrative   Right handed   Lives in [place], works in [place].      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 Difficulty of Paying Living Expenses:   Food Insecurity:   . Worried About Corporate investment banker in the Last Year:   . Programme researcher, broadcasting/film/video in  the Last Year:   Transportation Needs:   . Freight forwarder (Medical):   Marland Kitchen Lack of Transportation (Non-Medical):   Physical Activity:   . Days of Exercise per Week:   . Minutes of Exercise per Session:   Stress:   . Feeling of Stress :   Social Connections:   . Frequency of Communication with Friends and Family:   . Frequency of Social Gatherings with Friends and Family:   . Attends Religious Services:   . Active Member of Clubs or Organizations:   . Attends Banker Meetings:   Marland Kitchen Marital Status:   Intimate Partner Violence:   . Fear of Current or Ex-Partner:   . Emotionally Abused:   Marland Kitchen Physically Abused:   . Sexually Abused:      PHYSICAL EXAM  Vitals:   09/30/19 1042  BP: (!) 150/94  Pulse: (!) 104  Weight: (!) 304 lb 3.2 oz (138 kg)  Height: 6\' 3"  (1.905 m)    Body mass index is 38.02 kg/m.   General: The patient is well-developed and well-nourished and in no acute distress  HEENT:  Head is [place]/AT.  Sclera are anicteric.  Funduscopic exam shows normal optic discs and retinal vessels.  Neck: No carotid bruits are noted.  The neck is nontender.  Cardiovascular: The heart has a regular rate and rhythm with a normal S1 and S2. There were no murmurs, gallops or rubs.    Skin: Extremities are  without rash or  edema.  Neurologic Exam  Mental status: The patient is alert and oriented x 3 at the time of the examination. The patient has apparent normal recent and remote memory, with an apparently normal attention span and concentration ability.   Speech is normal.  Cranial nerves: Extraocular movements are full. Pupils are equal, round, and reactive to light and accomodation.  Visual fields are full.  Facial symmetry is present. Facial strength is normal.  Trapezius and sternocleidomastoid strength is normal. No dysarthria is noted.  The tongue is midline, and the patient has symmetric elevation of the soft palate. No obvious hearing deficits are noted.  Motor:  Muscle bulk is normal.   Tone is normal. Strength is  5 / 5 in all 4 extremities.   Sensory: Sensory testing is intact to pinprick, soft touch and vibration sensation in all 4 extremities.  Coordination: Cerebellar testing reveals good finger-nose-finger and heel-to-shin bilaterally.  Gait and station: Station is normal.   Gait is normal. Tandem gait is normal. Romberg is negative.   Reflexes: Deep tendon reflexes are symmetric and normal bilaterally.       DIAGNOSTIC DATA (LABS, IMAGING, TESTING) - I reviewed patient records, labs, notes, testing and imaging myself where available.  Lab Results  Component Value Date   WBC 6.9 04/11/2019   HGB 15.3 04/11/2019   HCT 44.5 04/11/2019   MCV 83.5 04/11/2019   PLT 207.0 04/11/2019      Component Value Date/Time   NA 139 08/05/2019 0840   K 4.1 08/05/2019 0840   CL 104 08/05/2019 0840   CO2 30 08/05/2019 0840   GLUCOSE 139 (H) 08/05/2019 0840   BUN 13 08/05/2019 0840   CREATININE 0.99 08/05/2019 0840   CALCIUM 9.2 08/05/2019 0840   PROT 6.8 04/11/2019 1029   ALBUMIN 4.4 04/11/2019 1029   AST 35 04/11/2019 1029   ALT 47 04/11/2019 1029   ALKPHOS 57 04/11/2019 1029   BILITOT 1.0 04/11/2019 1029   Lab Results  Component Value  Date   CHOL 140 04/11/2019   HDL  34.10 (L) 04/11/2019   LDLCALC 87 04/11/2019   LDLDIRECT 137.0 10/14/2014   TRIG 94.0 04/11/2019   CHOLHDL 4 04/11/2019   Lab Results  Component Value Date   HGBA1C 6.5 08/05/2019   Lab Results  Component Value Date   VITAMINB12 393 10/22/2017   Lab Results  Component Value Date   TSH 2.07 10/22/2017       ASSESSMENT AND PLAN  Visual aura  Essential hypertension  Snoring  In summary, Todd Willis is a 49 year old man with visual disturbances.  The characteristics (binocular and either left or right visual field) and duration of the episodes are consistent with visual aura or a acephalgic migraines.  Because of these events are not very common, I would not recommend a prophylactic treatment at this time.  However, if the spells do not resolve or become more frequent we will need to consider.  If this occurs I would recommend that we consider a beta-blocker or calcium channel blocker as it may help the aura as well as help keep the blood pressure under better control.  Also had a conversation about blood pressure and risk of cardiovascular and stroke events in the future.  He has had more difficulty getting the blood pressure under control this year.  If control requires a third medication, I would recommend that we consider getting a sleep study as OSA can make blood pressure control harder to achieve.  He will ask his wife if she has ever noted gasping or pauses in breathing at night.  If so, we will check a PSG.  He will return to see me as needed but call if he has new or worsening symptoms or if the visual auras do not abate.  Thank you vascular to see Todd Willis.  Please let me know if I can be of further assistance with him or other patients in the future.  Alzada Brazee A. Epimenio FootSater, MD, [facility],FAAN 09/30/2019, 6:04 PM Certified in Neurology, Clinical Neurophysiology, Sleep Medicine and Neuroimaging  [facility] [address], [address] [place], [place]  [id number] [phone]

## 2019-10-23 ENCOUNTER — Other Ambulatory Visit: Payer: Self-pay | Admitting: Family

## 2019-10-23 DIAGNOSIS — K219 Gastro-esophageal reflux disease without esophagitis: Secondary | ICD-10-CM

## 2019-10-23 DIAGNOSIS — E785 Hyperlipidemia, unspecified: Secondary | ICD-10-CM

## 2019-10-29 ENCOUNTER — Other Ambulatory Visit: Payer: Self-pay | Admitting: Family

## 2019-10-29 ENCOUNTER — Encounter: Payer: Self-pay | Admitting: Family

## 2019-10-29 ENCOUNTER — Ambulatory Visit (INDEPENDENT_AMBULATORY_CARE_PROVIDER_SITE_OTHER): Payer: BC Managed Care – PPO | Admitting: Family

## 2019-10-29 ENCOUNTER — Other Ambulatory Visit: Payer: Self-pay

## 2019-10-29 VITALS — BP 130/100 | HR 80 | Temp 98.3°F | Ht 75.0 in | Wt 297.8 lb

## 2019-10-29 DIAGNOSIS — H539 Unspecified visual disturbance: Secondary | ICD-10-CM

## 2019-10-29 DIAGNOSIS — I10 Essential (primary) hypertension: Secondary | ICD-10-CM | POA: Diagnosis not present

## 2019-10-29 DIAGNOSIS — E119 Type 2 diabetes mellitus without complications: Secondary | ICD-10-CM

## 2019-10-29 DIAGNOSIS — E785 Hyperlipidemia, unspecified: Secondary | ICD-10-CM

## 2019-10-29 DIAGNOSIS — R0683 Snoring: Secondary | ICD-10-CM

## 2019-10-29 MED ORDER — AMLODIPINE BESYLATE 5 MG PO TABS: 2.5000 mg | ORAL_TABLET | Freq: Every day | ORAL | 3 refills | Status: DC

## 2019-10-29 NOTE — Assessment & Plan Note (Signed)
Uncontrolled. Will start low-dose calcium channel blocker as recommended by Dr Epimenio Foot. Patient will increase from 2.5mg  to 5mg  after one week if blood pressure not approaching 120/80

## 2019-10-29 NOTE — Assessment & Plan Note (Signed)
One recurrence since our last visit in May 3 months ago. Reviewed Dr Ledon Snare note; will start CCB

## 2019-10-29 NOTE — Assessment & Plan Note (Signed)
Patient is not on 3 antihypertensives at maximum dose. He doesn't exhibit fatigue, ha.  Advised of risks of untreated sleep apnea in regards to increased risk for CVA, MI.  he declines sleep study at this time however we will continue to discuss in the future , particularly blood pressure has not improved.

## 2019-10-29 NOTE — Patient Instructions (Addendum)
Start amlodipine 2.5mg   It is imperative that you are seen AT least twice per year for labs and monitoring. Monitor blood pressure at home and me 5-6 reading on separate days. Goal is less than 120/80, based on newest guidelines, however we certainly want to be less than 130/80;  if persistently higher, please make sooner follow up appointment so we can recheck you blood pressure and manage/ adjust medications.  Let me know if you would like to pursue a sleep study.

## 2019-10-29 NOTE — Assessment & Plan Note (Signed)
Taking crestor 10mg  and advised to take additional 5mg  1-2 per week.

## 2019-10-29 NOTE — Progress Notes (Signed)
Subjective:    Patient ID: Bartley Vuolo, male    DOB: 03/17/1971, 49 y.o.   MRN: [id number]  CC: Zeeshan Korte is a 49 y.o. male who presents today for follow up.   HPI: Feels well today No complaints.  HTN- started increased losartan however not sure if helped lower blood pressure. He also reports a lot of stress this week. At home today 140/100. Not checking BP very regularly. Variable as lowest 120/82.   No cp,sob.  Wife hasnt noticed any apneic episodes. He does report snoring.  NO fatigue, HA.  DM- compliant with metformin. Pleased with weight loss.   HLD-taking 10mg  with 5mg  additional crestor, no further myalgias.     One recurrence of aura since last visit. Very infrequent as is.  Saw Dr for consult for visual disturbances; consider Beta blocker, CCB.    HISTORY:  Past Medical History:  Diagnosis Date  . Anxiety    on buspar and xanax in past  . Diabetes (HCC)   . Diverticulitis   . GERD (gastroesophageal reflux disease)    uses zegrid occas  . H/O cardiovascular stress test    normal per pt  . High cholesterol   . History of MRI of brain and brain stem    Normal per pt  . Hypertension    Past Surgical History:  Procedure Laterality Date  . NO PAST SURGERIES     Family History  Problem Relation Age of Onset  . Hypertension Mother   . Aneurysm Mother   . Hyperlipidemia Father   . Heart disease Father   . Stroke Father 76  . Hypertension Father   . Diabetes Father   . Heart disease Maternal Grandmother   . Hypertension Maternal Grandmother   . Depression Maternal Grandmother   . Aneurysm Maternal Grandmother   . Heart disease Maternal Grandfather   . Hypertension Maternal Grandfather   . Depression Maternal Grandfather   . Aneurysm Maternal Grandfather   . Stroke Paternal Grandfather 22    Allergies: Patient has no known allergies. Current Outpatient Medications on File Prior to Visit  Medication Sig Dispense Refill  . aspirin EC 81  MG tablet Take 1 tablet (81 mg total) by mouth daily. 30 tablet 11  . hydrochlorothiazide (MICROZIDE) 12.5 MG capsule Take 1 capsule (12.5 mg total) by mouth daily. 90 capsule 1  . losartan (COZAAR) 100 MG tablet Take 1 tablet (100 mg total) by mouth daily. 90 tablet 3  . metFORMIN (GLUCOPHAGE-XR) 500 MG 24 hr tablet Take one tablet (500mg ) daily in the evening. 30 tablet 3  . Multiple Vitamin (MULTIVITAMIN) tablet Take 1 tablet by mouth daily.    . pantoprazole (PROTONIX) 40 MG tablet TAKE 1 TABLET(40 MG) BY MOUTH DAILY. TAKE ON EMPTY STOMACH 90 tablet 0  . rosuvastatin (CRESTOR) 10 MG tablet TAKE 1 TABLET(10 MG) BY MOUTH DAILY 90 tablet 0   No current facility-administered medications on file prior to visit.    Social History   Tobacco Use  . Smoking status: Never Smoker  . Smokeless tobacco: Never Used  Substance Use Topics  . Alcohol use: No  . Drug use: No    Review of Systems  Constitutional: Negative for chills, fatigue, fever and unexpected weight change.  Eyes: Positive for visual disturbance.  Respiratory: Negative for cough.   Cardiovascular: Negative for chest pain, palpitations and leg swelling.  Gastrointestinal: Negative for nausea and vomiting.  Musculoskeletal: Negative for myalgias.  Neurological: Negative for dizziness and  headaches.      Objective:    BP (!) 130/100   Pulse 80   Temp 98.3 F (36.8 C)   Ht 6\' 3"  (1.905 m)   Wt 297 lb 12.8 oz (135.1 kg)   SpO2 99%   BMI 37.22 kg/m  BP Readings from Last 3 Encounters:  10/29/19 (!) 130/100  09/30/19 (!) 150/94  07/29/19 (!) 140/92   Wt Readings from Last 3 Encounters:  10/29/19 297 lb 12.8 oz (135.1 kg)  09/30/19 (!) 304 lb 3.2 oz (138 kg)  07/29/19 (!) 303 lb 1.9 oz (137.5 kg)    Physical Exam Vitals reviewed.  Constitutional:      Appearance: He is well-developed.  Cardiovascular:     Rate and Rhythm: Regular rhythm.     Heart sounds: Normal heart sounds.  Pulmonary:     Effort:  Pulmonary effort is normal. No respiratory distress.     Breath sounds: Normal breath sounds. No wheezing, rhonchi or rales.  Skin:    General: Skin is warm and dry.  Neurological:     Mental Status: He is alert.  Psychiatric:        Speech: Speech normal.        Behavior: Behavior normal.        Assessment & Plan:   Problem List Items Addressed This Visit      Cardiovascular and Mediastinum   Essential hypertension - Primary    Uncontrolled. Will start low-dose calcium channel blocker as recommended by Dr 09/28/19. Patient will increase from 2.5mg  to 5mg  after one week if blood pressure not approaching 120/80      Relevant Medications   amLODipine (NORVASC) 5 MG tablet     Other   Hyperlipidemia    Taking crestor 10mg  and advised to take additional 5mg  1-2 per week.      Relevant Medications   amLODipine (NORVASC) 5 MG tablet   Snoring    Patient is not on 3 antihypertensives at maximum dose. He doesn't exhibit fatigue, ha.  Advised of risks of untreated sleep apnea in regards to increased risk for CVA, MI.  he declines sleep study at this time however we will continue to discuss in the future , particularly blood pressure has not improved.       Visual aura    One recurrence since our last visit in May 3 months ago. Reviewed Dr note; will start CCB          I am having "[name]" start on amLODipine. I am also having him maintain his multivitamin, aspirin EC, losartan, hydrochlorothiazide, metFORMIN, pantoprazole, and rosuvastatin.   Meds ordered this encounter  Medications  . amLODipine (NORVASC) 5 MG tablet    Sig: Take 0.5 tablets (2.5 mg total) by mouth daily.    Dispense:  90 tablet    Refill:  3    Order Specific Question:   Supervising Provider    Answer:   [[id number]]    Return precautions given.   Risks, benefits, and alternatives of the medications and treatment plan prescribed today were discussed, and patient  expressed understanding.   Education regarding symptom management and diagnosis given to patient on AVS.  Continue to follow with May 5, FNP for routine health maintenance.   Ledon Snare and I agreed with plan.   Joslyn Devon, FNP

## 2019-10-30 ENCOUNTER — Other Ambulatory Visit: Payer: Self-pay

## 2019-10-30 MED ORDER — METFORMIN HCL ER 500 MG PO TB24: ORAL_TABLET | ORAL | 1 refills | Status: DC

## 2019-11-06 ENCOUNTER — Other Ambulatory Visit: Payer: Self-pay

## 2019-11-06 ENCOUNTER — Other Ambulatory Visit (INDEPENDENT_AMBULATORY_CARE_PROVIDER_SITE_OTHER): Payer: BC Managed Care – PPO

## 2019-11-06 DIAGNOSIS — E119 Type 2 diabetes mellitus without complications: Secondary | ICD-10-CM | POA: Diagnosis not present

## 2019-11-06 LAB — HEMOGLOBIN A1C: Hgb A1c MFr Bld: 6.3 % (ref 4.6–6.5)

## 2019-11-07 ENCOUNTER — Other Ambulatory Visit: Payer: BC Managed Care – PPO

## 2020-01-18 ENCOUNTER — Other Ambulatory Visit: Payer: Self-pay | Admitting: Family

## 2020-01-18 DIAGNOSIS — I10 Essential (primary) hypertension: Secondary | ICD-10-CM

## 2020-01-21 DIAGNOSIS — M25561 Pain in right knee: Secondary | ICD-10-CM | POA: Diagnosis not present

## 2020-01-25 ENCOUNTER — Other Ambulatory Visit: Payer: Self-pay | Admitting: Family

## 2020-01-25 DIAGNOSIS — K219 Gastro-esophageal reflux disease without esophagitis: Secondary | ICD-10-CM

## 2020-01-30 ENCOUNTER — Ambulatory Visit (INDEPENDENT_AMBULATORY_CARE_PROVIDER_SITE_OTHER): Payer: BC Managed Care – PPO | Admitting: Family

## 2020-01-30 ENCOUNTER — Encounter: Payer: Self-pay | Admitting: Family

## 2020-01-30 ENCOUNTER — Other Ambulatory Visit: Payer: Self-pay

## 2020-01-30 VITALS — BP 136/88 | HR 68 | Temp 98.2°F | Ht 75.75 in | Wt 303.8 lb

## 2020-01-30 DIAGNOSIS — H539 Unspecified visual disturbance: Secondary | ICD-10-CM

## 2020-01-30 DIAGNOSIS — Z7982 Long term (current) use of aspirin: Secondary | ICD-10-CM

## 2020-01-30 DIAGNOSIS — E785 Hyperlipidemia, unspecified: Secondary | ICD-10-CM

## 2020-01-30 DIAGNOSIS — I10 Essential (primary) hypertension: Secondary | ICD-10-CM | POA: Diagnosis not present

## 2020-01-30 DIAGNOSIS — Z1159 Encounter for screening for other viral diseases: Secondary | ICD-10-CM

## 2020-01-30 NOTE — Assessment & Plan Note (Signed)
No recurrence. Will continue to monitor.

## 2020-01-30 NOTE — Progress Notes (Signed)
Subjective:    Patient ID: Todd Willis, male    DOB: 1970-09-18, 49 y.o.   MRN: [id number]  CC: Todd Willis is a 49 y.o. male who presents today for follow up.   HPI: Feels well today No new complaints  HTN- at home 130/90. Compliant with norvasc 2.5mg , hctz 12.5mg  and losartan 100mg  qpm. Had dizziness originally with amlodipine however has resolved. No cp, sob.   HLD- takes 10mg  daily and then adds 5mg  1 day per week without myalgias.   Hasnt had recurrent episode of visual aura        Taking daily asa 81mg  started by prior PCP.  Family h/o cvd which is his greatest concern.  No h/o GIB. No family h/o colon cancer.   Declines colonoscopy   HISTORY:  Past Medical History:  Diagnosis Date  . Anxiety    on buspar and xanax in past  . Diabetes (HCC)   . Diverticulitis   . GERD (gastroesophageal reflux disease)    uses zegrid occas  . H/O cardiovascular stress test    normal per pt  . High cholesterol   . History of MRI of brain and brain stem    Normal per pt  . Hypertension    Past Surgical History:  Procedure Laterality Date  . NO PAST SURGERIES     Family History  Problem Relation Age of Onset  . Hypertension Mother   . Hyperlipidemia Father   . Heart disease Father   . Stroke Father 74  . Hypertension Father   . Diabetes Father   . Heart disease Maternal Grandmother   . Hypertension Maternal Grandmother   . Depression Maternal Grandmother   . Aneurysm Maternal Grandmother   . Heart disease Maternal Grandfather   . Hypertension Maternal Grandfather   . Depression Maternal Grandfather   . Aneurysm Maternal Grandfather   . Stroke Paternal Grandfather 29    Allergies: Patient has no known allergies. Current Outpatient Medications on File Prior to Visit  Medication Sig Dispense Refill  . amLODipine (NORVASC) 5 MG tablet Take 0.5 tablets (2.5 mg total) by mouth daily. 90 tablet 3  . aspirin EC 81 MG tablet Take 1 tablet (81 mg total) by mouth  daily. 30 tablet 11  . hydrochlorothiazide (MICROZIDE) 12.5 MG capsule TAKE 1 CAPSULE(12.5 MG) BY MOUTH DAILY 90 capsule 1  . losartan (COZAAR) 100 MG tablet Take 1 tablet (100 mg total) by mouth daily. 90 tablet 3  . metFORMIN (GLUCOPHAGE-XR) 500 MG 24 hr tablet TAKE 1 TABLET(500 MG) BY MOUTH DAILY IN THE EVENING 90 tablet 1  . Multiple Vitamin (MULTIVITAMIN) tablet Take 1 tablet by mouth daily.    . pantoprazole (PROTONIX) 40 MG tablet TAKE 1 TABLET(40 MG) BY MOUTH DAILY. TAKE ON EMPTY STOMACH 90 tablet 0  . rosuvastatin (CRESTOR) 10 MG tablet TAKE 1 TABLET(10 MG) BY MOUTH DAILY 90 tablet 0   No current facility-administered medications on file prior to visit.    Social History   Tobacco Use  . Smoking status: Never Smoker  . Smokeless tobacco: Never Used  Substance Use Topics  . Alcohol use: No  . Drug use: No    Review of Systems  Constitutional: Negative for chills and fever.  Respiratory: Negative for cough.   Cardiovascular: Negative for chest pain and palpitations.  Gastrointestinal: Negative for nausea and vomiting.  Neurological: Negative for dizziness.      Objective:    BP 136/88   Pulse 68   Temp  98.2 F (36.8 C)   Ht 6' 3.75" (1.924 m)   Wt (!) 303 lb 12.8 oz (137.8 kg)   SpO2 99%   BMI 37.22 kg/m  BP Readings from Last 3 Encounters:  01/30/20 136/88  10/29/19 (!) 130/100  09/30/19 (!) 150/94   Wt Readings from Last 3 Encounters:  01/30/20 (!) 303 lb 12.8 oz (137.8 kg)  10/29/19 297 lb 12.8 oz (135.1 kg)  09/30/19 (!) 304 lb 3.2 oz (138 kg)    Physical Exam Vitals reviewed.  Constitutional:      Appearance: He is well-developed.  Cardiovascular:     Rate and Rhythm: Regular rhythm.     Heart sounds: Normal heart sounds.  Pulmonary:     Effort: Pulmonary effort is normal. No respiratory distress.     Breath sounds: Normal breath sounds. No wheezing, rhonchi or rales.  Skin:    General: Skin is warm and dry.  Neurological:     Mental Status:  He is alert.  Psychiatric:        Speech: Speech normal.        Behavior: Behavior normal.        Assessment & Plan:   Problem List Items Addressed This Visit      Cardiovascular and Mediastinum   Essential hypertension    Uncontrolled. Advised to start taking amlodipine 2.5mg  qhs, continue hctz 12.5mg  and losartan 100mg . If not at goal, advised that we will increase amlodipine to 5mg         Other   Encounter for long-term (current) use of aspirin    discussed ASA use at length today. Advised reasonable to on ASA based on age < 33 years and family history. We will continue to discuss at future visits and discuss bleeding risks.        Hyperlipidemia - Primary    Presume improved. Continue crestor 10mg  with one dose of crestor 5mg  per week as tolerated.       Visual aura    No recurrence. Will continue to monitor.           I am having "Todd Willis" maintain his multivitamin, aspirin EC, losartan, rosuvastatin, amLODipine, metFORMIN, hydrochlorothiazide, and pantoprazole.   No orders of the defined types were placed in this encounter.   Return precautions given.   Risks, benefits, and alternatives of the medications and treatment plan prescribed today were discussed, and patient expressed understanding.   Education regarding symptom management and diagnosis given to patient on AVS.  Continue to follow with , FNP for routine health maintenance.   and I agreed with plan.   Joslyn Devon, FNP

## 2020-01-30 NOTE — Assessment & Plan Note (Signed)
Presume improved. Continue crestor 10mg  with one dose of crestor 5mg  per week as tolerated.

## 2020-01-30 NOTE — Assessment & Plan Note (Signed)
discussed ASA use at length today. Advised reasonable to on ASA based on age < 44 years and family history. We will continue to discuss at future visits and discuss bleeding risks.

## 2020-01-30 NOTE — Patient Instructions (Signed)
Trial taking amlodipine 2.5mg  at night. If not closer to 120/80, you may start amlodipine 5mg .  Please let me know when I can order colonoscopy.  Nice to see you!

## 2020-01-30 NOTE — Assessment & Plan Note (Signed)
Uncontrolled. Advised to start taking amlodipine 2.5mg  qhs, continue hctz 12.5mg  and losartan 100mg . If not at goal, advised that we will increase amlodipine to 5mg 

## 2020-02-02 ENCOUNTER — Other Ambulatory Visit (INDEPENDENT_AMBULATORY_CARE_PROVIDER_SITE_OTHER): Payer: BC Managed Care – PPO

## 2020-02-02 ENCOUNTER — Other Ambulatory Visit: Payer: Self-pay

## 2020-02-02 DIAGNOSIS — I10 Essential (primary) hypertension: Secondary | ICD-10-CM | POA: Diagnosis not present

## 2020-02-02 DIAGNOSIS — Z1159 Encounter for screening for other viral diseases: Secondary | ICD-10-CM | POA: Diagnosis not present

## 2020-02-02 DIAGNOSIS — E785 Hyperlipidemia, unspecified: Secondary | ICD-10-CM

## 2020-02-02 LAB — COMPREHENSIVE METABOLIC PANEL
ALT: 32 U/L (ref 0–53)
AST: 24 U/L (ref 0–37)
Albumin: 4.3 g/dL (ref 3.5–5.2)
Alkaline Phosphatase: 54 U/L (ref 39–117)
BUN: 16 mg/dL (ref 6–23)
CO2: 31 mEq/L (ref 19–32)
Calcium: 9.2 mg/dL (ref 8.4–10.5)
Chloride: 102 mEq/L (ref 96–112)
Creatinine, Ser: 1.03 mg/dL (ref 0.40–1.50)
GFR: 85.39 mL/min (ref >60.00)
Glucose, Bld: 132 mg/dL — ABNORMAL HIGH (ref 70–99)
Potassium: 4.2 mEq/L (ref 3.5–5.1)
Sodium: 140 mEq/L (ref 135–145)
Total Bilirubin: 0.9 mg/dL (ref 0.2–1.2)
Total Protein: 6.6 g/dL (ref 6.0–8.3)

## 2020-02-02 LAB — LIPID PANEL
Cholesterol: 131 mg/dL (ref 0–200)
HDL: 36.5 mg/dL — ABNORMAL LOW (ref >39.00)
LDL Cholesterol: 63 mg/dL (ref 0–99)
NonHDL: 94.67
Total CHOL/HDL Ratio: 4
Triglycerides: 156 mg/dL — ABNORMAL HIGH (ref 0.0–149.0)
VLDL: 31.2 mg/dL (ref 0.0–40.0)

## 2020-02-03 ENCOUNTER — Encounter: Payer: Self-pay | Admitting: Family

## 2020-02-03 LAB — HEPATITIS C ANTIBODY
Hepatitis C Ab: NONREACTIVE
SIGNAL TO CUT-OFF: 0.01 (ref <1.00)

## 2020-03-22 ENCOUNTER — Other Ambulatory Visit: Payer: Self-pay | Admitting: Family

## 2020-03-22 DIAGNOSIS — E785 Hyperlipidemia, unspecified: Secondary | ICD-10-CM

## 2020-03-26 DIAGNOSIS — L570 Actinic keratosis: Secondary | ICD-10-CM | POA: Diagnosis not present

## 2020-03-26 DIAGNOSIS — L918 Other hypertrophic disorders of the skin: Secondary | ICD-10-CM | POA: Diagnosis not present

## 2020-03-26 DIAGNOSIS — B078 Other viral warts: Secondary | ICD-10-CM | POA: Diagnosis not present

## 2020-03-26 DIAGNOSIS — D2261 Melanocytic nevi of right upper limb, including shoulder: Secondary | ICD-10-CM | POA: Diagnosis not present

## 2020-03-26 DIAGNOSIS — L538 Other specified erythematous conditions: Secondary | ICD-10-CM | POA: Diagnosis not present

## 2020-03-26 DIAGNOSIS — X32XXXA Exposure to sunlight, initial encounter: Secondary | ICD-10-CM | POA: Diagnosis not present

## 2020-03-26 DIAGNOSIS — D2262 Melanocytic nevi of left upper limb, including shoulder: Secondary | ICD-10-CM | POA: Diagnosis not present

## 2020-03-26 DIAGNOSIS — D2271 Melanocytic nevi of right lower limb, including hip: Secondary | ICD-10-CM | POA: Diagnosis not present

## 2020-03-26 DIAGNOSIS — D225 Melanocytic nevi of trunk: Secondary | ICD-10-CM | POA: Diagnosis not present

## 2020-03-29 ENCOUNTER — Encounter: Payer: Self-pay | Admitting: Family

## 2020-03-30 ENCOUNTER — Other Ambulatory Visit: Payer: Self-pay

## 2020-03-30 DIAGNOSIS — I10 Essential (primary) hypertension: Secondary | ICD-10-CM

## 2020-03-30 MED ORDER — AMLODIPINE BESYLATE 5 MG PO TABS: 5.0000 mg | ORAL_TABLET | Freq: Every day | ORAL | 3 refills | Status: DC

## 2020-05-02 ENCOUNTER — Other Ambulatory Visit: Payer: Self-pay | Admitting: Family

## 2020-05-02 DIAGNOSIS — K219 Gastro-esophageal reflux disease without esophagitis: Secondary | ICD-10-CM

## 2020-05-07 ENCOUNTER — Ambulatory Visit: Payer: BC Managed Care – PPO | Admitting: Family

## 2020-05-28 ENCOUNTER — Ambulatory Visit: Payer: BC Managed Care – PPO | Admitting: Family

## 2020-06-27 ENCOUNTER — Other Ambulatory Visit: Payer: Self-pay | Admitting: Family

## 2020-06-27 DIAGNOSIS — E785 Hyperlipidemia, unspecified: Secondary | ICD-10-CM

## 2020-06-29 ENCOUNTER — Ambulatory Visit (INDEPENDENT_AMBULATORY_CARE_PROVIDER_SITE_OTHER): Payer: BC Managed Care – PPO | Admitting: Family

## 2020-06-29 ENCOUNTER — Other Ambulatory Visit: Payer: Self-pay

## 2020-06-29 ENCOUNTER — Encounter: Payer: Self-pay | Admitting: Family

## 2020-06-29 VITALS — BP 124/88 | HR 107 | Temp 98.1°F | Ht 75.0 in | Wt 310.6 lb

## 2020-06-29 DIAGNOSIS — E785 Hyperlipidemia, unspecified: Secondary | ICD-10-CM

## 2020-06-29 DIAGNOSIS — I10 Essential (primary) hypertension: Secondary | ICD-10-CM | POA: Diagnosis not present

## 2020-06-29 DIAGNOSIS — Z125 Encounter for screening for malignant neoplasm of prostate: Secondary | ICD-10-CM | POA: Diagnosis not present

## 2020-06-29 DIAGNOSIS — E119 Type 2 diabetes mellitus without complications: Secondary | ICD-10-CM

## 2020-06-29 LAB — COMPREHENSIVE METABOLIC PANEL
ALT: 55 U/L — ABNORMAL HIGH (ref 0–53)
AST: 39 U/L — ABNORMAL HIGH (ref 0–37)
Albumin: 4.4 g/dL (ref 3.5–5.2)
Alkaline Phosphatase: 65 U/L (ref 39–117)
BUN: 15 mg/dL (ref 6–23)
CO2: 30 mEq/L (ref 19–32)
Calcium: 9.9 mg/dL (ref 8.4–10.5)
Chloride: 100 mEq/L (ref 96–112)
Creatinine, Ser: 1.01 mg/dL (ref 0.40–1.50)
GFR: 87.17 mL/min (ref >60.00)
Glucose, Bld: 145 mg/dL — ABNORMAL HIGH (ref 70–99)
Potassium: 4.5 mEq/L (ref 3.5–5.1)
Sodium: 138 mEq/L (ref 135–145)
Total Bilirubin: 0.9 mg/dL (ref 0.2–1.2)
Total Protein: 7.2 g/dL (ref 6.0–8.3)

## 2020-06-29 LAB — HEMOGLOBIN A1C: Hgb A1c MFr Bld: 7.3 % — ABNORMAL HIGH (ref 4.6–6.5)

## 2020-06-29 LAB — MICROALBUMIN / CREATININE URINE RATIO
Creatinine,U: 112.1 mg/dL
Microalb Creat Ratio: 2.9 mg/g (ref 0.0–30.0)
Microalb, Ur: 3.2 mg/dL — ABNORMAL HIGH (ref 0.0–1.9)

## 2020-06-29 LAB — PSA: PSA: 0.43 ng/mL (ref 0.10–4.00)

## 2020-06-29 NOTE — Progress Notes (Signed)
Subjective:    Patient ID: Todd Willis, male    DOB: [birth year], 50 y.o.   MRN: [id number]  CC: Todd Willis is a 50 y.o. male who presents today for follow up.   HPI: Feels well today No complaints  HTN- compliant with amlodipine 5mg , with losartan 100mg , hctz 12.5mg . At home BP 135/80. No cp, sob  HLD- compliant with crestor 10mg  qd and then crestor 5mg  once per week with crestor 10mg . NO myalagias on this dose.   DM- compliant with metformin 500mg  qd  NO pain, trouble urinating or decreased urine flow.  HISTORY:  Past Medical History:  Diagnosis Date  . Anxiety    on buspar and xanax in past  . Diabetes (HCC)   . Diverticulitis   . GERD (gastroesophageal reflux disease)    uses zegrid occas  . H/O cardiovascular stress test    normal per pt  . High cholesterol   . History of MRI of brain and brain stem    Normal per pt  . Hypertension    Past Surgical History:  Procedure Laterality Date  . NO PAST SURGERIES     Family History  Problem Relation Age of Onset  . Hypertension Mother   . Hyperlipidemia Father   . Heart disease Father   . Stroke Father 34  . Hypertension Father   . Diabetes Father   . Heart disease Maternal Grandmother   . Hypertension Maternal Grandmother   . Depression Maternal Grandmother   . Aneurysm Maternal Grandmother   . Heart disease Maternal Grandfather   . Hypertension Maternal Grandfather   . Depression Maternal Grandfather   . Aneurysm Maternal Grandfather   . Stroke Paternal Grandfather 30    Allergies: Patient has no known allergies. Current Outpatient Medications on File Prior to Visit  Medication Sig Dispense Refill  . amLODipine (NORVASC) 5 MG tablet Take 1 tablet (5 mg total) by mouth daily. 90 tablet 3  . aspirin EC 81 MG tablet Take 1 tablet (81 mg total) by mouth daily. 30 tablet 11  . hydrochlorothiazide (MICROZIDE) 12.5 MG capsule TAKE 1 CAPSULE(12.5 MG) BY MOUTH DAILY 90 capsule 1  . losartan (COZAAR) 100 MG  tablet Take 1 tablet (100 mg total) by mouth daily. 90 tablet 3  . metFORMIN (GLUCOPHAGE-XR) 500 MG 24 hr tablet TAKE 1 TABLET(500 MG) BY MOUTH DAILY IN THE EVENING 90 tablet 1  . Multiple Vitamin (MULTIVITAMIN) tablet Take 1 tablet by mouth daily.    . pantoprazole (PROTONIX) 40 MG tablet TAKE 1 TABLET(40 MG) BY MOUTH DAILY. TAKE ON EMPTY STOMACH 90 tablet 0  . rosuvastatin (CRESTOR) 10 MG tablet TAKE 1 TABLET(10 MG) BY MOUTH DAILY 90 tablet 0  . rosuvastatin (CRESTOR) 5 MG tablet Take 5 mg by mouth once a week.     No current facility-administered medications on file prior to visit.    Social History   Tobacco Use  . Smoking status: Never Smoker  . Smokeless tobacco: Never Used  Substance Use Topics  . Alcohol use: No  . Drug use: No    Review of Systems    Objective:    BP 124/88 (BP Location: Left Arm, Patient Position: Sitting, Cuff Size: Large)   Pulse (!) 107   Temp 98.1 F (36.7 C) (Oral)   Ht 6\' 3"  (1.905 m)   Wt (!) 310 lb 9.6 oz (140.9 kg)   SpO2 99%   BMI 38.82 kg/m  BP Readings from Last 3 Encounters:  06/29/20  124/88  01/30/20 136/88  10/29/19 (!) 130/100   Wt Readings from Last 3 Encounters:  06/29/20 (!) 310 lb 9.6 oz (140.9 kg)  01/30/20 (!) 303 lb 12.8 oz (137.8 kg)  10/29/19 297 lb 12.8 oz (135.1 kg)    Physical Exam     Assessment & Plan:   Problem List Items Addressed This Visit      Cardiovascular and Mediastinum   Essential hypertension - Primary   Relevant Medications   rosuvastatin (CRESTOR) 5 MG tablet   Other Relevant Orders   Comprehensive metabolic panel     Endocrine   Diabetes mellitus without complication (HCC)    Pending a1c. Continue metformin 500mg  qd      Relevant Medications   rosuvastatin (CRESTOR) 5 MG tablet   Other Relevant Orders   Hemoglobin A1c   Microalbumin / creatinine urine ratio     Other   Hyperlipidemia    LDL < 70. Continue crestor 10mg  qd and then crestor 5mg  once per week with crestor 10mg  as  he doesn't have myalgias on this dose      Relevant Medications   rosuvastatin (CRESTOR) 5 MG tablet   Screening for prostate cancer   Relevant Orders   PSA       I am having "[name]" maintain his multivitamin, aspirin EC, losartan, metFORMIN, hydrochlorothiazide, amLODipine, pantoprazole, rosuvastatin, and rosuvastatin.   No orders of the defined types were placed in this encounter.   Return precautions given.   Risks, benefits, and alternatives of the medications and treatment plan prescribed today were discussed, and patient expressed understanding.   Education regarding symptom management and diagnosis given to patient on AVS.  Continue to follow with , FNP for routine health maintenance.   and I agreed with plan.   Joslyn Devon, FNP

## 2020-06-29 NOTE — Assessment & Plan Note (Signed)
LDL < 70. Continue crestor 10mg  qd and then crestor 5mg  once per week with crestor 10mg  as he doesn't have myalgias on this dose

## 2020-06-29 NOTE — Patient Instructions (Signed)
Nice to see you!   

## 2020-06-29 NOTE — Assessment & Plan Note (Signed)
Pending a1c. Continue metformin 500mg  qd

## 2020-06-30 ENCOUNTER — Telehealth: Payer: Self-pay

## 2020-06-30 ENCOUNTER — Encounter: Payer: Self-pay | Admitting: Family

## 2020-06-30 ENCOUNTER — Other Ambulatory Visit: Payer: Self-pay | Admitting: Family

## 2020-06-30 DIAGNOSIS — R809 Proteinuria, unspecified: Secondary | ICD-10-CM

## 2020-06-30 NOTE — Telephone Encounter (Signed)
LMTCB for lab results.  

## 2020-07-08 ENCOUNTER — Telehealth: Payer: Self-pay

## 2020-07-08 NOTE — Telephone Encounter (Signed)
LM once again to call back. Pt needs labs & I needed to ask him a few questions.

## 2020-07-12 ENCOUNTER — Other Ambulatory Visit: Payer: Self-pay

## 2020-07-12 DIAGNOSIS — R809 Proteinuria, unspecified: Secondary | ICD-10-CM

## 2020-07-12 NOTE — Telephone Encounter (Signed)
See result note.  

## 2020-07-12 NOTE — Telephone Encounter (Signed)
Pt returned your call and wants a call before 1 today

## 2020-07-18 ENCOUNTER — Other Ambulatory Visit: Payer: Self-pay | Admitting: Family

## 2020-07-18 DIAGNOSIS — I10 Essential (primary) hypertension: Secondary | ICD-10-CM

## 2020-07-26 ENCOUNTER — Other Ambulatory Visit: Payer: Self-pay | Admitting: Family

## 2020-07-26 DIAGNOSIS — K219 Gastro-esophageal reflux disease without esophagitis: Secondary | ICD-10-CM

## 2020-08-04 ENCOUNTER — Telehealth: Payer: Self-pay | Admitting: *Deleted

## 2020-08-04 NOTE — Telephone Encounter (Signed)
Please place future lab order for CMP if still needed. Only urine test orders found.

## 2020-08-06 ENCOUNTER — Other Ambulatory Visit: Payer: Self-pay | Admitting: Family

## 2020-08-06 ENCOUNTER — Other Ambulatory Visit (INDEPENDENT_AMBULATORY_CARE_PROVIDER_SITE_OTHER): Payer: BC Managed Care – PPO

## 2020-08-06 ENCOUNTER — Other Ambulatory Visit: Payer: Self-pay

## 2020-08-06 DIAGNOSIS — R748 Abnormal levels of other serum enzymes: Secondary | ICD-10-CM

## 2020-08-06 DIAGNOSIS — R809 Proteinuria, unspecified: Secondary | ICD-10-CM | POA: Diagnosis not present

## 2020-08-06 LAB — COMPREHENSIVE METABOLIC PANEL
ALT: 60 U/L — ABNORMAL HIGH (ref 0–53)
AST: 35 U/L (ref 0–37)
Albumin: 4.6 g/dL (ref 3.5–5.2)
Alkaline Phosphatase: 59 U/L (ref 39–117)
BUN: 17 mg/dL (ref 6–23)
CO2: 30 mEq/L (ref 19–32)
Calcium: 10 mg/dL (ref 8.4–10.5)
Chloride: 102 mEq/L (ref 96–112)
Creatinine, Ser: 1.07 mg/dL (ref 0.40–1.50)
GFR: 81.28 mL/min (ref >60.00)
Glucose, Bld: 134 mg/dL — ABNORMAL HIGH (ref 70–99)
Potassium: 4.4 mEq/L (ref 3.5–5.1)
Sodium: 140 mEq/L (ref 135–145)
Total Bilirubin: 0.8 mg/dL (ref 0.2–1.2)
Total Protein: 6.9 g/dL (ref 6.0–8.3)

## 2020-08-06 LAB — MICROALBUMIN / CREATININE URINE RATIO
Creatinine,U: 111 mg/dL
Microalb Creat Ratio: 0.7 mg/g (ref 0.0–30.0)
Microalb, Ur: 0.8 mg/dL (ref 0.0–1.9)

## 2020-08-09 ENCOUNTER — Encounter: Payer: Self-pay | Admitting: Family

## 2020-08-10 ENCOUNTER — Other Ambulatory Visit: Payer: Self-pay | Admitting: Family

## 2020-08-11 ENCOUNTER — Other Ambulatory Visit: Payer: Self-pay | Admitting: Family

## 2020-08-11 DIAGNOSIS — I10 Essential (primary) hypertension: Secondary | ICD-10-CM

## 2020-08-11 MED ORDER — CARVEDILOL 3.125 MG PO TABS: 3.1250 mg | ORAL_TABLET | Freq: Two times a day (BID) | ORAL | 3 refills | Status: DC

## 2020-10-25 ENCOUNTER — Other Ambulatory Visit: Payer: Self-pay | Admitting: Family

## 2020-10-25 DIAGNOSIS — K219 Gastro-esophageal reflux disease without esophagitis: Secondary | ICD-10-CM

## 2020-11-06 DIAGNOSIS — Z20822 Contact with and (suspected) exposure to covid-19: Secondary | ICD-10-CM | POA: Diagnosis not present

## 2020-11-06 DIAGNOSIS — U071 COVID-19: Secondary | ICD-10-CM | POA: Diagnosis not present

## 2020-11-06 DIAGNOSIS — R059 Cough, unspecified: Secondary | ICD-10-CM | POA: Diagnosis not present

## 2020-11-06 DIAGNOSIS — R5383 Other fatigue: Secondary | ICD-10-CM | POA: Diagnosis not present

## 2020-11-06 DIAGNOSIS — R519 Headache, unspecified: Secondary | ICD-10-CM | POA: Diagnosis not present

## 2020-11-08 ENCOUNTER — Telehealth: Payer: Self-pay | Admitting: Family

## 2020-11-08 NOTE — Telephone Encounter (Signed)
Call patient There is no contraindication to taking paxlovid With a history of elevated liver enzymes.he may take paxlovid

## 2020-11-08 NOTE — Telephone Encounter (Signed)
Patient informed, Due to the high volume of calls and your symptoms we have to forward your call to our Triage Nurse to expedient your call. Please hold for the transfer.  Patient transferred to Access Nurse. Due to testing positive for COVID over the weekend.He was prescribed paxlovid but he is unsure if he should take it due to having elevated liver enzymes.No openings in office or virtual.

## 2020-11-08 NOTE — Telephone Encounter (Signed)
Patient has been notified

## 2020-11-09 ENCOUNTER — Encounter: Payer: Self-pay | Admitting: Family

## 2020-11-09 ENCOUNTER — Telehealth (INDEPENDENT_AMBULATORY_CARE_PROVIDER_SITE_OTHER): Payer: BC Managed Care – PPO | Admitting: Family

## 2020-11-09 VITALS — BP 122/87 | Ht 75.0 in | Wt 299.0 lb

## 2020-11-09 DIAGNOSIS — E119 Type 2 diabetes mellitus without complications: Secondary | ICD-10-CM

## 2020-11-09 DIAGNOSIS — I10 Essential (primary) hypertension: Secondary | ICD-10-CM | POA: Diagnosis not present

## 2020-11-09 DIAGNOSIS — U071 COVID-19: Secondary | ICD-10-CM

## 2020-11-09 DIAGNOSIS — E785 Hyperlipidemia, unspecified: Secondary | ICD-10-CM | POA: Diagnosis not present

## 2020-11-09 NOTE — Assessment & Plan Note (Signed)
Improving with conservative treatment.  Patient opted not to start Paxil which I think is appropriate.  He will let me know how he is doing

## 2020-11-09 NOTE — Assessment & Plan Note (Signed)
Due for A1c.  Patient will call to schedule this lab.  Continue metformin 500mg  once daily

## 2020-11-09 NOTE — Assessment & Plan Note (Addendum)
Stable, chronic. Discussed goal < 120/80 and patient is more comfortable with goal < 130/90. Continue Coreg 3.125 mg twice daily, losartan 100mg , hydrochlorothiazide 12.5mg .  Encouraged continued lifestyle changes as it relates to eating healthier, weight loss

## 2020-11-09 NOTE — Progress Notes (Signed)
Virtual Visit via Video Note  I connected with@  on 11/09/20 at  8:00 AM EDT by a video enabled telemedicine application and verified that I am speaking with the correct person using two identifiers.  Location patient: home Location provider:work  Persons participating in the virtual visit: patient, provider  I discussed the limitations of evaluation and management by telemedicine and the availability of in person appointments. The patient expressed understanding and agreed to proceed.   HPI: Currently has covid.and was seen 4 days ago at urgent care. He didn't feel 'awful'.  Symptoms started 5 days ago with congestion. Congestion is improving. No sob, HA.  Low grade fever, tmax 100.5. 'slight HA' which is improving.    He did not start paxlovid.  Diabetes-compliant with metformin 500 mg daily  Hyperlipidemia-due to LFTs , he is holding crestor 10mg  qd   Hypertension- He is no longer on amlodipine. compliant with Coreg 3.125 mg twice daily, losartan 100mg , hydrochlorothiazide 12.5mg . at home, BP 120/85-90.   He is eating healthier and has lost approximately 10 pounds.  ROS: See pertinent positives and negatives per HPI.    EXAM:  VITALS per patient if applicable: BP 122/87   Ht 6\' 3"  (1.905 m)   Wt 299 lb (135.6 kg)   BMI 37.37 kg/m  BP Readings from Last 3 Encounters:  11/09/20 122/87  06/29/20 124/88  01/30/20 136/88   Wt Readings from Last 3 Encounters:  11/09/20 299 lb (135.6 kg)  06/29/20 (!) 310 lb 9.6 oz (140.9 kg)  01/30/20 (!) 303 lb 12.8 oz (137.8 kg)    GENERAL: alert, oriented, appears well and in no acute distress  HEENT: atraumatic, conjunttiva clear, no obvious abnormalities on inspection of external nose and ears  NECK: normal movements of the head and neck  LUNGS: on inspection no signs of respiratory distress, breathing rate appears normal, no obvious gross SOB, gasping or wheezing  CV: no obvious cyanosis  MS: moves all visible  extremities without noticeable abnormality  PSYCH/NEURO: pleasant and cooperative, no obvious depression or anxiety, speech and thought processing grossly intact  ASSESSMENT AND PLAN:  Discussed the following assessment and plan:  Problem List Items Addressed This Visit       Cardiovascular and Mediastinum   Essential hypertension    Stable, chronic. Discussed goal < 120/80 and patient is more comfortable with goal < 130/90. Continue Coreg 3.125 mg twice daily, losartan 100mg , hydrochlorothiazide 12.5mg .  Encouraged continued lifestyle changes as it relates to eating healthier, weight loss        Endocrine   Diabetes mellitus without complication (HCC) - Primary    Due for A1c.  Patient will call to schedule this lab.  Continue metformin 500mg  once daily      Relevant Orders   Hemoglobin A1c     Other   COVID-19    Improving with conservative treatment.  Patient opted not to start Paxil which I think is appropriate.  He will let me know how he is doing      Hyperlipidemia    Recent history of elevated LFTs.  Currently holding Crestor 10 mg daily.  He has had LFTs rechecked at urgent care and will send me these values.  If normalized, will resume Crestor at 5 mg daily       -we discussed possible serious and likely etiologies, options for evaluation and workup, limitations of telemedicine visit vs in person visit, treatment, treatment risks and precautions. Pt prefers to treat via telemedicine empirically rather  then risking or undertaking an in person visit at this moment.  .   I discussed the assessment and treatment plan with the patient. The patient was provided an opportunity to ask questions and all were answered. The patient agreed with the plan and demonstrated an understanding of the instructions.   The patient was advised to call back or seek an in-person evaluation if the symptoms worsen or if the condition fails to improve as anticipated.   Rennie Plowman, FNP

## 2020-11-09 NOTE — Assessment & Plan Note (Signed)
Recent history of elevated LFTs.  Currently holding Crestor 10 mg daily.  He has had LFTs rechecked at urgent care and will send me these values.  If normalized, will resume Crestor at 5 mg daily

## 2020-11-09 NOTE — Patient Instructions (Signed)
Please continue to hold Crestor 10 mg until you receive labs from urgent care.  If iver function tests have normalized, we can resume Crestor at 5 mg and recheck liver enzymes in 6 weeks time.  Certainly let me know of any further concerns as it relates to COVID.   Stay well

## 2020-11-14 ENCOUNTER — Encounter: Payer: Self-pay | Admitting: Family

## 2020-11-15 ENCOUNTER — Other Ambulatory Visit: Payer: Self-pay | Admitting: Family

## 2020-11-15 DIAGNOSIS — E119 Type 2 diabetes mellitus without complications: Secondary | ICD-10-CM

## 2020-11-15 MED ORDER — ROSUVASTATIN CALCIUM 5 MG PO TABS: 5.0000 mg | ORAL_TABLET | Freq: Every day | ORAL | 3 refills | Status: DC

## 2020-11-26 ENCOUNTER — Other Ambulatory Visit (INDEPENDENT_AMBULATORY_CARE_PROVIDER_SITE_OTHER): Payer: BC Managed Care – PPO

## 2020-11-26 ENCOUNTER — Other Ambulatory Visit: Payer: Self-pay

## 2020-11-26 DIAGNOSIS — E119 Type 2 diabetes mellitus without complications: Secondary | ICD-10-CM | POA: Diagnosis not present

## 2020-11-26 LAB — HEMOGLOBIN A1C: Hgb A1c MFr Bld: 6.6 % — ABNORMAL HIGH (ref 4.6–6.5)

## 2020-11-30 ENCOUNTER — Encounter: Payer: Self-pay | Admitting: Family

## 2020-11-30 DIAGNOSIS — H43813 Vitreous degeneration, bilateral: Secondary | ICD-10-CM | POA: Diagnosis not present

## 2020-12-08 ENCOUNTER — Other Ambulatory Visit: Payer: Self-pay

## 2020-12-08 DIAGNOSIS — R748 Abnormal levels of other serum enzymes: Secondary | ICD-10-CM

## 2020-12-08 NOTE — Telephone Encounter (Signed)
Patient calling back in and scheduled for labs 12/28/20 as he started medication 11/16/20.   Labs ordered

## 2020-12-21 DIAGNOSIS — Z20822 Contact with and (suspected) exposure to covid-19: Secondary | ICD-10-CM | POA: Diagnosis not present

## 2020-12-21 DIAGNOSIS — B349 Viral infection, unspecified: Secondary | ICD-10-CM | POA: Diagnosis not present

## 2020-12-21 DIAGNOSIS — R030 Elevated blood-pressure reading, without diagnosis of hypertension: Secondary | ICD-10-CM | POA: Diagnosis not present

## 2020-12-21 DIAGNOSIS — R0981 Nasal congestion: Secondary | ICD-10-CM | POA: Diagnosis not present

## 2020-12-28 ENCOUNTER — Other Ambulatory Visit: Payer: Self-pay

## 2020-12-28 ENCOUNTER — Other Ambulatory Visit (INDEPENDENT_AMBULATORY_CARE_PROVIDER_SITE_OTHER): Payer: BC Managed Care – PPO

## 2020-12-28 DIAGNOSIS — R748 Abnormal levels of other serum enzymes: Secondary | ICD-10-CM | POA: Diagnosis not present

## 2020-12-28 LAB — COMPREHENSIVE METABOLIC PANEL
ALT: 28 U/L (ref 0–53)
AST: 23 U/L (ref 0–37)
Albumin: 4.3 g/dL (ref 3.5–5.2)
Alkaline Phosphatase: 50 U/L (ref 39–117)
BUN: 18 mg/dL (ref 6–23)
CO2: 30 mEq/L (ref 19–32)
Calcium: 9.6 mg/dL (ref 8.4–10.5)
Chloride: 102 mEq/L (ref 96–112)
Creatinine, Ser: 1.01 mg/dL (ref 0.40–1.50)
GFR: 86.87 mL/min (ref >60.00)
Glucose, Bld: 152 mg/dL — ABNORMAL HIGH (ref 70–99)
Potassium: 4.4 mEq/L (ref 3.5–5.1)
Sodium: 139 mEq/L (ref 135–145)
Total Bilirubin: 0.6 mg/dL (ref 0.2–1.2)
Total Protein: 6.8 g/dL (ref 6.0–8.3)

## 2020-12-31 ENCOUNTER — Other Ambulatory Visit: Payer: Self-pay | Admitting: Family

## 2020-12-31 DIAGNOSIS — I10 Essential (primary) hypertension: Secondary | ICD-10-CM

## 2021-01-17 ENCOUNTER — Other Ambulatory Visit: Payer: Self-pay | Admitting: Family

## 2021-01-17 DIAGNOSIS — I10 Essential (primary) hypertension: Secondary | ICD-10-CM

## 2021-01-23 ENCOUNTER — Other Ambulatory Visit: Payer: Self-pay | Admitting: Family

## 2021-04-01 DIAGNOSIS — D2261 Melanocytic nevi of right upper limb, including shoulder: Secondary | ICD-10-CM | POA: Diagnosis not present

## 2021-04-01 DIAGNOSIS — D225 Melanocytic nevi of trunk: Secondary | ICD-10-CM | POA: Diagnosis not present

## 2021-04-01 DIAGNOSIS — D2262 Melanocytic nevi of left upper limb, including shoulder: Secondary | ICD-10-CM | POA: Diagnosis not present

## 2021-04-01 DIAGNOSIS — L718 Other rosacea: Secondary | ICD-10-CM | POA: Diagnosis not present

## 2021-04-24 ENCOUNTER — Other Ambulatory Visit: Payer: Self-pay | Admitting: Family

## 2021-04-24 DIAGNOSIS — K219 Gastro-esophageal reflux disease without esophagitis: Secondary | ICD-10-CM

## 2021-04-24 DIAGNOSIS — I10 Essential (primary) hypertension: Secondary | ICD-10-CM

## 2021-06-17 ENCOUNTER — Encounter: Payer: Self-pay | Admitting: Family

## 2021-06-17 ENCOUNTER — Ambulatory Visit (INDEPENDENT_AMBULATORY_CARE_PROVIDER_SITE_OTHER): Payer: BC Managed Care – PPO | Admitting: Family

## 2021-06-17 VITALS — BP 130/86 | HR 92 | Temp 98.8°F | Ht 75.0 in | Wt 311.6 lb

## 2021-06-17 DIAGNOSIS — Z125 Encounter for screening for malignant neoplasm of prostate: Secondary | ICD-10-CM

## 2021-06-17 DIAGNOSIS — I10 Essential (primary) hypertension: Secondary | ICD-10-CM | POA: Diagnosis not present

## 2021-06-17 DIAGNOSIS — Z8249 Family history of ischemic heart disease and other diseases of the circulatory system: Secondary | ICD-10-CM | POA: Diagnosis not present

## 2021-06-17 DIAGNOSIS — E119 Type 2 diabetes mellitus without complications: Secondary | ICD-10-CM | POA: Diagnosis not present

## 2021-06-17 DIAGNOSIS — E785 Hyperlipidemia, unspecified: Secondary | ICD-10-CM

## 2021-06-17 NOTE — Progress Notes (Signed)
? ?Subjective:  ? ? Patient ID: Todd Willis, male    DOB: [birth year], 51 y.o.   MRN: [id number] ? ?CC: Rakan Soffer is a 51 y.o. male who presents today for follow up.  ? ?HPI: He had a stress test 16 years ago which was normal. ?Works in the yard. Rides stationary bike for 45 minutes 3 times per week. No cp, sob, leg swelling. ? ?He reports snoring. ?Denies daily headaches, chronic fatigue ? ? ? ?Hypertension-compliant with Coreg 3.125 mg twice daily, losartan 100mg , hydrochlorothiazide 12.5mg . BP at home 130/80. ? ?DM-compliant with metformin 500 mg. Interested in weight loss.  ? ?HLD -compliant with Crestor 5mg  ?HISTORY:  ?Past Medical History:  ?Diagnosis Date  ? Anxiety   ? on buspar and xanax in past  ? Diabetes (HCC)   ? Diverticulitis   ? GERD (gastroesophageal reflux disease)   ? uses zegrid occas  ? H/O cardiovascular stress test   ? normal per pt  ? High cholesterol   ? History of MRI of brain and brain stem   ? Normal per pt  ? Hypertension   ? ?Past Surgical History:  ?Procedure Laterality Date  ? NO PAST SURGERIES    ? ?Family History  ?Problem Relation Age of Onset  ? Hypertension Mother   ? Heart disease Mother 63  ?     pacemaker  ? Hyperlipidemia Father   ? Heart disease Father   ? Stroke Father 2  ? Hypertension Father   ? Diabetes Father   ? Heart disease Maternal Grandmother   ? Hypertension Maternal Grandmother   ? Depression Maternal Grandmother   ? Aneurysm Maternal Grandmother   ?     thought to be brain  ? Heart disease Maternal Grandfather   ? Hypertension Maternal Grandfather   ? Depression Maternal Grandfather   ? Aneurysm Maternal Grandfather   ?     thought to be brain  ? Heart attack Paternal Grandfather 28  ? ? ?Allergies: Patient has no known allergies. ?Current Outpatient Medications on File Prior to Visit  ?Medication Sig Dispense Refill  ? aspirin EC 81 MG tablet Take 1 tablet (81 mg total) by mouth daily. 30 tablet 11  ? carvedilol (COREG) 3.125 MG tablet TAKE 1  TABLET(3.125 MG) BY MOUTH TWICE DAILY WITH A MEAL 60 tablet 3  ? hydrochlorothiazide (MICROZIDE) 12.5 MG capsule TAKE 1 CAPSULE(12.5 MG) BY MOUTH DAILY 90 capsule 1  ? losartan (COZAAR) 100 MG tablet TAKE 1 TABLET(100 MG) BY MOUTH DAILY 90 tablet 3  ? metFORMIN (GLUCOPHAGE-XR) 500 MG 24 hr tablet TAKE 1 TABLET(500 MG) BY MOUTH DAILY IN THE EVENING 90 tablet 1  ? Multiple Vitamin (MULTIVITAMIN) tablet Take 1 tablet by mouth daily.    ? pantoprazole (PROTONIX) 40 MG tablet TAKE 1 TABLET(40 MG) BY MOUTH DAILY ON AN EMPTY STOMACH 90 tablet 1  ? rosuvastatin (CRESTOR) 5 MG tablet Take 1 tablet (5 mg total) by mouth daily. 90 tablet 3  ? ?No current facility-administered medications on file prior to visit.  ? ? ?Social History  ? ?Tobacco Use  ? Smoking status: Never  ? Smokeless tobacco: Never  ?Substance Use Topics  ? Alcohol use: No  ? Drug use: No  ? ? ?Review of Systems  ?Constitutional:  Negative for chills and fever.  ?Respiratory:  Negative for cough and shortness of breath.   ?Cardiovascular:  Negative for chest pain, palpitations and leg swelling.  ?Gastrointestinal:  Negative for nausea and vomiting.  ?   ?  Objective:  ?  ?BP 130/86 (BP Location: Left Arm, Patient Position: Sitting, Cuff Size: Large)   Pulse 92   Temp 98.8 ?F (37.1 ?C) (Oral)   Ht 6\' 3"  (1.905 m)   Wt (!) 311 lb 9.6 oz (141.3 kg)   SpO2 99%   BMI 38.95 kg/m?  ?BP Readings from Last 3 Encounters:  ?06/17/21 130/86  ?11/09/20 122/87  ?06/29/20 124/88  ? ?Wt Readings from Last 3 Encounters:  ?06/17/21 (!) 311 lb 9.6 oz (141.3 kg)  ?11/09/20 299 lb (135.6 kg)  ?06/29/20 (!) 310 lb 9.6 oz (140.9 kg)  ? ? ?Physical Exam ?Vitals reviewed.  ?Constitutional:   ?   Appearance: He is well-developed.  ?Cardiovascular:  ?   Rate and Rhythm: Regular rhythm.  ?   Heart sounds: Normal heart sounds.  ?Pulmonary:  ?   Effort: Pulmonary effort is normal. No respiratory distress.  ?   Breath sounds: Normal breath sounds. No wheezing, rhonchi or rales.   ?Skin: ?   General: Skin is warm and dry.  ?Neurological:  ?   Mental Status: He is alert.  ?Psychiatric:     ?   Speech: Speech normal.     ?   Behavior: Behavior normal.  ? ? ?   ?Assessment & Plan:  ? ?Problem List Items Addressed This Visit   ? ?  ? Cardiovascular and Mediastinum  ? Essential hypertension  ?  Chronic, stable.  Continue Coreg 3.125 mg twice daily, losartan 100mg , hydrochlorothiazide 12.5mg  ?  ?  ?  ? Endocrine  ? Diabetes mellitus without complication (HCC) - Primary  ?  Anticipate controlled.  Pending A1c.  Discussed low glycemic diet, titrating metformin to aid in weight loss.  Advised patient to slowly titrate metformin to 2000 mg daily as per AVS instructions ?  ?  ? Relevant Orders  ? Comprehensive metabolic panel  ? Hemoglobin A1c  ? Lipid panel  ? CBC with Differential/Platelet  ? Microalbumin / creatinine urine ratio  ?  ? Other  ? Family history of ASCVD (arteriosclerotic cardiovascular disease)  ?  EKG shows normal sinus rhythm, no ischemia.  No significant changes compared to prior obtained 09/29/2013.  Questionable right bundle branch block lead V1.  In the setting of snoring, we jointly agreed referral to pulmonology for sleep study.   Due to family history, referral to cardiology for further restratification ?  ?  ? Relevant Orders  ? EKG 12-Lead  ? Lipid panel  ? Ambulatory referral to Cardiology  ? Ambulatory referral to Pulmonology  ? Hyperlipidemia  ?  Chronic, stable.  Pending lipid panel.  Continue Crestor 5 mg ?  ?  ? Screening for prostate cancer  ? Relevant Orders  ? PSA  ? ? ? ?I am having "[name]" maintain his multivitamin, aspirin EC, losartan, rosuvastatin, hydrochlorothiazide, metFORMIN, pantoprazole, and carvedilol. ? ? ?No orders of the defined types were placed in this encounter. ? ? ?Return precautions given.  ? ?Risks, benefits, and alternatives of the medications and treatment plan prescribed today were discussed, and patient expressed  understanding.  ? ?Education regarding symptom management and diagnosis given to patient on AVS. ? ?Continue to follow with Joslyn Devon, FNP for routine health maintenance.  ? ?03-15-1974 and I agreed with plan.  ? ?Allegra Grana, FNP ? ? ?

## 2021-06-17 NOTE — Patient Instructions (Addendum)
Referral to cardiology and pulmonology ?Let us know if you dont hear back within a week in regards to an appointment being scheduled.  ? ?For weight loss, I think titrating metformin as described to be very helpful ? ? Start taking at 1000 milligrams at bedtime ( as is generally better tolerated), you may increase by 500 mg a week as tolerated to a max dose of 2000 mg/day.  You may end up taking 1000mg  once in the morning and once at night; or you may find that taking the entire 2000mg  dose at bedtime works well.  ? Certainly call with questions.   ?This is  Dr.  example of a  "Low GI"  Diet:  It will allow you to lose 4 to 8  lbs  per month if you follow it carefully.  Your goal with exercise is a minimum of 30 minutes of aerobic exercise 5 days per week (Walking does not count once it becomes easy!)  ? ? All of the foods can be found at grocery stores and in bulk at [place].  The Atkins protein bars and shakes are available in more varieties at Target, WalMart and Lowe's Foods.   ? ? ?7 AM Breakfast:  Choose from the following: ? Low carbohydrate Protein  Shakes (I recommend the  Premier Protein chocolate shakes,  EAS AdvantEdge "Carb Control" shakes  Or the Atkins shakes all are under 3 net carbs)  ?   a scrambled egg/bacon/cheese burrito made with [employer]'s "carb balance" whole wheat tortilla  (about 10 net carbs ) ? [employer] sells [employer] (basically a quiche without the pastry crust) that is eaten cold and very convenient way to get your eggs.  8 carbs) ? If you make your own protein shakes, avoid bananas and pineapple,  And use low carb greek yogurt or original /unsweetened almond or soy milk  ? ? Avoid cereal and bananas, oatmeal and cream of wheat and grits. They are loaded with carbohydrates! ? ? ?10 AM: high protein snack: ? Protein bar by Atkins (the snack size, under 200 cal, usually < 6 net carbs).   ? A stick of cheese:  Around 1 carb,  100 cal    ? Dannon Light n Fit  Greek Yogurt  (80 cal, 8 carbs)  ?Other so called "protein bars" and Greek yogurts tend to be loaded with carbohydrates.  Remember, in food advertising, the word "energy" is synonymous for " carbohydrate." ? ?Lunch:  ? A Sandwich using the bread choices listed, Can use any  Eggs,  lunchmeat, grilled meat or canned tuna), avocado, regular mayo/mustard  and cheese. ? A Salad using blue cheese, ranch,  Goddess or vinagrette,  Avoid taco shells, croutons or "confetti" and no "candied nuts" but regular nuts OK.  ? No pretzels, nabs  or chips.  Pickles and miniature sweet peppers are a good low carb alternative that provide a "crunch" ? The bread is the only source of carbohydrate in a sandwich and  can be decreased by trying some of the attached alternatives to traditional loaf bread ?  ?Avoid "Low fat dressings, as well as [employer] and [facility] dressings They are loaded with sugar! ? ? ?3 PM/ Mid day  Snack: ? Consider  1 ounce of  almonds, walnuts, pistachios, pecans, peanuts,  Macadamia nuts or a nut medley. ? Avoid "granola and granola bars " ? Mixed nuts are ok in moderation as long as there are no raisins,  cranberries or dried fruit.  ? KIND bars are OK if you get the low glycemic index variety  ? Try the prosciutto/mozzarella cheese sticks by [employer]  In deli /backery section   High protein  ?  ?  ?6 PM  Dinner:   ?  Meat/fowl/fish with a green salad, and either broccoli, cauliflower, green beans, spinach, brussel sprouts or  Lima beans. DO NOT BREAD THE PROTEIN!!   ?   There is a low carb pasta by Dreamfield's that is acceptable and tastes great: only 5 digestible carbs/serving.( All grocery stores but BJs carry it ) ?Several ready made meals are available low carb:  ? Try Michel Angelo's chicken piccata or chicken or eggplant parm over low carb pasta.(Lowes and BJs)  ? Clifton Custard Sanchez's "Carnitas" (pulled pork, no sauce,  0 carbs) or his beef pot roast to make a dinner burrito (at [employer]) ? Pesto over low  carb pasta (bj's sells a good quality pesto in the center refrigerated section of the deli  ? Try satueeing  Roosvelt Harps with mushroooms as a good side  ? [employer] Giant makes a mashed cauliflower that tastes like mashed potatoes ? ?Whole wheat pasta is still full of digestible carbs and  Not as low in glycemic index as Dreamfield's.   ?Brown rice is still rice,  So skip the rice and noodles if you eat [place] or [place] (or at least limit to 1/2 cup) ? ?9 PM snack :  ? Breyer's "low carb" fudgsicle or  ice cream bar (Carb Smart line), or  Weight Watcher's ice cream bar , or another "no sugar added" ice cream; ? a serving of fresh berries/cherries with whipped cream  ? Cheese or DANNON'S LlGHT N FIT GREEK YOGURT ? 8 ounces of [place] unsweetened almond/cococunut milk   ? Treat yourself to a parfait made with whipped cream blueberiies, walnuts and vanilla greek yogurt ? ?Avoid bananas, pineapple, grapes  and watermelon on a regular basis because they are high in sugar.  THINK OF THEM AS DESSERT ? ?Remember that snack Substitutions should be less than 10 NET carbs per serving and meals < 20 carbs. Remember to subtract fiber grams to get the "net carbs." ? ? ? ? ?

## 2021-06-17 NOTE — Assessment & Plan Note (Signed)
Chronic, stable.  Pending lipid panel.  Continue Crestor 5mg °

## 2021-06-17 NOTE — Assessment & Plan Note (Signed)
Chronic, stable.  Continue Coreg 3.125 mg twice daily, losartan 100mg , hydrochlorothiazide 12.5mg  ?

## 2021-06-17 NOTE — Assessment & Plan Note (Signed)
EKG shows normal sinus rhythm, no ischemia.  No significant changes compared to prior obtained 09/29/2013.  Questionable right bundle branch block lead V1.  In the setting of snoring, we jointly agreed referral to pulmonology for sleep study.   Due to family history, referral to cardiology for further restratification ?

## 2021-06-17 NOTE — Assessment & Plan Note (Signed)
Anticipate controlled.  Pending A1c.  Discussed low glycemic diet, titrating metformin to aid in weight loss.  Advised patient to slowly titrate metformin to 2000 mg daily as per AVS instructions ?

## 2021-06-22 ENCOUNTER — Other Ambulatory Visit: Payer: Self-pay | Admitting: Family

## 2021-06-22 DIAGNOSIS — E119 Type 2 diabetes mellitus without complications: Secondary | ICD-10-CM

## 2021-06-22 MED ORDER — METFORMIN HCL ER 500 MG PO TB24: 1000.0000 mg | ORAL_TABLET | Freq: Two times a day (BID) | ORAL | 2 refills | Status: DC

## 2021-06-22 NOTE — Telephone Encounter (Signed)
Spoke with patient and answered his questions according to providers notes and assure his referrals have been made and that a lipid panel has been ordered. ?

## 2021-07-01 ENCOUNTER — Encounter: Payer: Self-pay | Admitting: Cardiology

## 2021-07-01 ENCOUNTER — Ambulatory Visit (INDEPENDENT_AMBULATORY_CARE_PROVIDER_SITE_OTHER): Payer: BC Managed Care – PPO | Admitting: Cardiology

## 2021-07-01 ENCOUNTER — Other Ambulatory Visit (INDEPENDENT_AMBULATORY_CARE_PROVIDER_SITE_OTHER): Payer: BC Managed Care – PPO

## 2021-07-01 VITALS — BP 150/110 | HR 73 | Ht 75.0 in | Wt 312.4 lb

## 2021-07-01 DIAGNOSIS — E7800 Pure hypercholesterolemia, unspecified: Secondary | ICD-10-CM

## 2021-07-01 DIAGNOSIS — Z6839 Body mass index (BMI) 39.0-39.9, adult: Secondary | ICD-10-CM | POA: Diagnosis not present

## 2021-07-01 DIAGNOSIS — E119 Type 2 diabetes mellitus without complications: Secondary | ICD-10-CM

## 2021-07-01 DIAGNOSIS — Z125 Encounter for screening for malignant neoplasm of prostate: Secondary | ICD-10-CM | POA: Diagnosis not present

## 2021-07-01 DIAGNOSIS — R072 Precordial pain: Secondary | ICD-10-CM

## 2021-07-01 DIAGNOSIS — Z8249 Family history of ischemic heart disease and other diseases of the circulatory system: Secondary | ICD-10-CM | POA: Diagnosis not present

## 2021-07-01 DIAGNOSIS — I10 Essential (primary) hypertension: Secondary | ICD-10-CM | POA: Diagnosis not present

## 2021-07-01 LAB — CBC WITH DIFFERENTIAL/PLATELET
Basophils Absolute: 0 10*3/uL (ref 0.0–0.1)
Basophils Relative: 0.5 % (ref 0.0–3.0)
Eosinophils Absolute: 0.1 10*3/uL (ref 0.0–0.7)
Eosinophils Relative: 2.6 % (ref 0.0–5.0)
HCT: 42.3 % (ref 39.0–52.0)
Hemoglobin: 14.3 g/dL (ref 13.0–17.0)
Lymphocytes Relative: 29.4 % (ref 12.0–46.0)
Lymphs Abs: 1.6 10*3/uL (ref 0.7–4.0)
MCHC: 33.8 g/dL (ref 30.0–36.0)
MCV: 84.4 fl (ref 78.0–100.0)
Monocytes Absolute: 0.4 10*3/uL (ref 0.1–1.0)
Monocytes Relative: 8.3 % (ref 3.0–12.0)
Neutro Abs: 3.1 10*3/uL (ref 1.4–7.7)
Neutrophils Relative %: 59.2 % (ref 43.0–77.0)
Platelets: 194 10*3/uL (ref 150.0–400.0)
RBC: 5.01 Mil/uL (ref 4.22–5.81)
RDW: 13.9 % (ref 11.5–15.5)
WBC: 5.3 10*3/uL (ref 4.0–10.5)

## 2021-07-01 LAB — COMPREHENSIVE METABOLIC PANEL
ALT: 41 U/L (ref 0–53)
AST: 32 U/L (ref 0–37)
Albumin: 4.3 g/dL (ref 3.5–5.2)
Alkaline Phosphatase: 56 U/L (ref 39–117)
BUN: 16 mg/dL (ref 6–23)
CO2: 29 mEq/L (ref 19–32)
Calcium: 9.2 mg/dL (ref 8.4–10.5)
Chloride: 102 mEq/L (ref 96–112)
Creatinine, Ser: 0.92 mg/dL (ref 0.40–1.50)
GFR: 96.82 mL/min (ref >60.00)
Glucose, Bld: 183 mg/dL — ABNORMAL HIGH (ref 70–99)
Potassium: 4.2 mEq/L (ref 3.5–5.1)
Sodium: 139 mEq/L (ref 135–145)
Total Bilirubin: 0.8 mg/dL (ref 0.2–1.2)
Total Protein: 6.2 g/dL (ref 6.0–8.3)

## 2021-07-01 LAB — LIPID PANEL
Cholesterol: 146 mg/dL (ref 0–200)
HDL: 36.1 mg/dL — ABNORMAL LOW (ref >39.00)
LDL Cholesterol: 74 mg/dL (ref 0–99)
NonHDL: 109.57
Total CHOL/HDL Ratio: 4
Triglycerides: 177 mg/dL — ABNORMAL HIGH (ref 0.0–149.0)
VLDL: 35.4 mg/dL (ref 0.0–40.0)

## 2021-07-01 LAB — MICROALBUMIN / CREATININE URINE RATIO
Creatinine,U: 100.7 mg/dL
Microalb Creat Ratio: 2.9 mg/g (ref 0.0–30.0)
Microalb, Ur: 2.9 mg/dL — ABNORMAL HIGH (ref 0.0–1.9)

## 2021-07-01 LAB — HEMOGLOBIN A1C: Hgb A1c MFr Bld: 8.2 % — ABNORMAL HIGH (ref 4.6–6.5)

## 2021-07-01 LAB — PSA: PSA: 0.24 ng/mL (ref 0.10–4.00)

## 2021-07-01 MED ORDER — HYDROCHLOROTHIAZIDE 25 MG PO TABS: 25.0000 mg | ORAL_TABLET | Freq: Every day | ORAL | 5 refills | Status: DC

## 2021-07-01 MED ORDER — IVABRADINE HCL 5 MG PO TABS: 10.0000 mg | ORAL_TABLET | Freq: Once | ORAL | 0 refills | Status: AC

## 2021-07-01 MED ORDER — METOPROLOL TARTRATE 100 MG PO TABS: 100.0000 mg | ORAL_TABLET | Freq: Once | ORAL | 0 refills | Status: DC

## 2021-07-01 NOTE — Patient Instructions (Signed)
Medication Instructions:  ? ?Your physician has recommended you make the following change in your medication:  ? ?INCREASE your Hydrochlorothiazide to 25 MG once a day. ? ? ?*If you need a refill on your cardiac medications before your next appointment, please call your pharmacy* ? ? ?Lab Work: ? ?None ordered ? ?If you have labs (blood work) drawn today and your tests are completely normal, you will receive your results only by: ?MyChart Message (if you have MyChart) OR ?A paper copy in the mail ?If you have any lab test that is abnormal or we need to change your treatment, we will call you to review the results. ? ? ?Testing/Procedures: ? ?Your physician has requested that you have an echocardiogram. Echocardiography is a painless test that uses sound waves to create images of your heart. It provides your doctor with information about the size and shape of your heart and how well your heart?s chambers and valves are working. This procedure takes approximately one hour. There are no restrictions for this procedure. ? ?2.  Your physician has requested that you have cardiac CT. Cardiac computed tomography (CT) is a painless test that uses an x-ray machine to take clear, detailed pictures of your heart.  ? ? ?Your cardiac CT will be scheduled at: ? ?[facility] ?[address] ?[address] ?[place], [place] [address] ?[phone] ? ?Thursday 07/07/21 at 8:15 am ? ? ?Please arrive 15 mins early for check-in and test prep. ? ? ? ?Please follow these instructions carefully (unless otherwise directed): ? ? ? ?On the Night Before the Test: ?Be sure to Drink plenty of water. ?Do not consume any caffeinated/decaffeinated beverages or chocolate 12 hours prior to your test. ? ? ? ?On the Day of the Test: ?Drink plenty of water until 1 hour prior to the test. ?Do not eat any food 4 hours prior to the test. ?You may take your regular medications prior to the test.  ?Take metoprolol (Lopressor)  100 MG two hours prior to test. ?Take Ivabradine (Corlanor) 10 MG two hours prior to your test. ?DO NOT take your Hydrochlorothiazide morning of the test. ? ? ? ?   After the Test: ?Drink plenty of water. ?After receiving IV contrast, you may experience a mild flushed feeling. This is normal. ?On occasion, you may experience a mild rash up to 24 hours after the test. This is not dangerous. If this occurs, you can take Benadryl 25 mg and increase your fluid intake. ?If you experience trouble breathing, this can be serious. If it is severe call 911 IMMEDIATELY. If it is mild, please call our office. ?If you take any of these medications: Glipizide/Metformin, Avandament, Glucavance, please do not take 48 hours after completing test unless otherwise instructed. ? ?Please allow 2-4 weeks for scheduling of routine cardiac CTs. Some insurance companies require a pre-authorization which may delay scheduling of this test.  ? ?For non-scheduling related questions, please contact the cardiac imaging nurse navigator should you have any questions/concerns: ?Rockwell Alexandria, Cardiac Imaging Nurse Navigator ?Larey Brick, Cardiac Imaging Nurse Navigator ?[facility] ?Direct Office Dial: [phone]  ? ?For scheduling needs, including cancellations and rescheduling, please call [place], [phone].  ? ? ? ?Follow-Up: ?At [facility], you and your health needs are our priority.  As part of our continuing mission to provide you with exceptional heart care, we have created designated Provider Care Teams.  These Care Teams include your primary Cardiologist (physician) and Advanced  Practice Providers (APPs -  Physician Assistants and Nurse Practitioners) who all work together to provide you with the care you need, when you need it. ? ?We recommend signing up for the patient portal called "MyChart".  Sign up information is provided on this After Visit Summary.  MyChart is used to connect with patients for  Virtual Visits (Telemedicine).  Patients are able to view lab/test results, encounter notes, upcoming appointments, etc.  Non-urgent messages can be sent to your provider as well.   ?To learn more about what you can do with MyChart, go to [email or web address].   ? ?Your next appointment:   ?Follow up after testing  ? ?The format for your next appointment:   ?In Person ? ?Provider:   ?You may see Debbe Odea, MD or one of the following Advanced Practice Providers on your designated Care Team:   ?Nicolasa Ducking, NP ?Eula Listen, PA-C ?Cadence Fransico Michael, PA-C  ? ? ? ? ?Important Information About Sugar ? ? ? ? ? ? ?

## 2021-07-01 NOTE — Progress Notes (Signed)
?Cardiology Office Note:   ? ?Date:  07/01/2021  ? ?ID:  Todd Willis, DOB 1970/12/23, MRN [id number] ? ?PCP:  Allegra Grana, FNP ?  ?[facility] Providers ?Cardiologist:  Debbe Odea, MD    ? ?Referring MD: Allegra Grana, FNP  ? ?Chief Complaint  ?Patient presents with  ? Other  ?  Fm Hx ASCVD; Pt would like to discuss BP. Meds reviewed verbally with pt.  ? ?Todd Willis is a 51 y.o. male who is being seen today for the evaluation of cardiovascular disease at the request of Jason Coop, Lyn Records, FNP. ? ? ?History of Present Illness:   ? ?Todd Willis is a 51 y.o. male with a hx of hypertension, hyperlipidemia, diabetes who presents due to hypertension and family history of cardiovascular disease. ? ?Patient has on and off chest discomfort ongoing over the past several months.  Burping sometimes improves symptoms.  Father and grandfather has history of heart attacks.  Father in his 7s, grandfather in his 5s.  Father also had strokes.  Mother has a pacemaker.  Due to cardiovascular disease in family, patient would like to get checked.  Was previously on amlodipine for BP control, this was stopped due to elevated LFTs.  Currently takes Coreg 3.125, losartan 100, HCTZ 12.5.  Tolerating current medications with no adverse effects.  Refered to sleep specialist due to snoring by PCP. ? ?Past Medical History:  ?Diagnosis Date  ? Anxiety   ? on buspar and xanax in past  ? Diabetes (HCC)   ? Diverticulitis   ? GERD (gastroesophageal reflux disease)   ? uses zegrid occas  ? H/O cardiovascular stress test   ? normal per pt  ? High cholesterol   ? History of MRI of brain and brain stem   ? Normal per pt  ? Hypertension   ? ? ?Past Surgical History:  ?Procedure Laterality Date  ? NO PAST SURGERIES    ? ? ?Current Medications: ?Current Meds  ?Medication Sig  ? aspirin EC 81 MG tablet Take 1 tablet (81 mg total) by mouth daily.  ? carvedilol (COREG) 3.125 MG tablet TAKE 1 TABLET(3.125 MG) BY MOUTH TWICE  DAILY WITH A MEAL  ? hydrochlorothiazide (HYDRODIURIL) 25 MG tablet Take 1 tablet (25 mg total) by mouth daily.  ? ivabradine (CORLANOR) 5 MG TABS tablet Take 2 tablets (10 mg total) by mouth once for 1 dose. Take 2 hours prior to your CT Scan.  ? losartan (COZAAR) 100 MG tablet TAKE 1 TABLET(100 MG) BY MOUTH DAILY  ? metFORMIN (GLUCOPHAGE-XR) 500 MG 24 hr tablet Take 2 tablets (1,000 mg total) by mouth 2 (two) times daily.  ? metoprolol tartrate (LOPRESSOR) 100 MG tablet Take 1 tablet (100 mg total) by mouth once for 1 dose. Take 2 hours prior to your CT scan.  ? Multiple Vitamin (MULTIVITAMIN) tablet Take 1 tablet by mouth daily.  ? pantoprazole (PROTONIX) 40 MG tablet TAKE 1 TABLET(40 MG) BY MOUTH DAILY ON AN EMPTY STOMACH  ? rosuvastatin (CRESTOR) 5 MG tablet Take 1 tablet (5 mg total) by mouth daily.  ? [DISCONTINUED] hydrochlorothiazide (MICROZIDE) 12.5 MG capsule TAKE 1 CAPSULE(12.5 MG) BY MOUTH DAILY  ?  ? ?Allergies:   Patient has no known allergies.  ? ?Social History  ? ?Socioeconomic History  ? Marital status: Married  ?  Spouse name: Talin Feister  ? Number of children: 2  ? Years of education: BS  ? Highest education level: Not on file  ?Occupational History  ?  Occupation: [employer]  ?Tobacco Use  ? Smoking status: Never  ? Smokeless tobacco: Never  ?Substance and Sexual Activity  ? Alcohol use: No  ? Drug use: No  ? Sexual activity: Not on file  ?Other Topics Concern  ? Not on file  ?Social History Narrative  ? Right handed  ? [place], [place] in [employer]  ?   ? 1-2 sodas per day  ? 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 Exercise - elliptical 30min 3x per week  ? Diet - low carbohydrate  ? ?Social Determinants of Health  ? ?Financial Resource Strain: Not on file  ?Food Insecurity: Not on file  ?Transportation Needs: Not on file  ?Physical Activity: Not on file  ?Stress: Not on file  ?Social Connections: Not on file  ?  ? ?Family History: ?The patient's family history includes  Aneurysm in his maternal grandfather and maternal grandmother; Depression in his maternal grandfather and maternal grandmother; Diabetes in his father; Heart attack (age of onset: 1965) in his paternal grandfather; Heart disease in his father, maternal grandfather, and maternal grandmother; Heart disease (age of onset: 3873) in his mother; Hyperlipidemia in his father; Hypertension in his father, maternal grandfather, maternal grandmother, and mother; Stroke (age of onset: 4065) in his father. ? ?ROS:   ?Please see the history of present illness.    ? All other systems reviewed and are negative. ? ?EKGs/Labs/Other Studies Reviewed:   ? ?The following studies were reviewed today: ? ? ?EKG:  EKG is  ordered today.  The ekg ordered today demonstrates normal sinus rhythm, normal ECG ? ?Recent Labs: ?12/28/2020: ALT 28; BUN 18; Creatinine, Ser 1.01; Potassium 4.4; Sodium 139  ?Recent Lipid Panel ?   ?Component Value Date/Time  ? CHOL 131 02/02/2020 0843  ? TRIG 156.0 (H) 02/02/2020 0843  ? HDL 36.50 (L) 02/02/2020 0843  ? CHOLHDL 4 02/02/2020 0843  ? VLDL 31.2 02/02/2020 0843  ? LDLCALC 63 02/02/2020 0843  ? LDLDIRECT 137.0 10/14/2014 0904  ? ? ? ?Risk Assessment/Calculations:   ? ? ?    ? ?Physical Exam:   ? ?VS:  BP (!) 150/110 (BP Location: Right Arm, Patient Position: Sitting, Cuff Size: Large)   Pulse 73   Ht 6\' 3"  (1.905 m)   Wt (!) 312 lb 6 oz (141.7 kg)   SpO2 99%   BMI 39.04 kg/m?    ? ?Wt Readings from Last 3 Encounters:  ?07/01/21 (!) 312 lb 6 oz (141.7 kg)  ?06/17/21 (!) 311 lb 9.6 oz (141.3 kg)  ?11/09/20 299 lb (135.6 kg)  ?  ? ?GEN:  Well nourished, well developed in no acute distress ?HEENT: Normal ?NECK: No JVD; No carotid bruits ?LYMPHATICS: No lymphadenopathy ?CARDIAC: RRR, no murmurs, rubs, gallops ?RESPIRATORY:  Clear to auscultation without rales, wheezing or rhonchi  ?ABDOMEN: Soft, non-tender, non-distended ?MUSCULOSKELETAL:  No edema; No deformity  ?SKIN: Warm and dry ?NEUROLOGIC:  Alert and  oriented x 3 ?PSYCHIATRIC:  Normal affect  ? ?ASSESSMENT:   ? ?1. Precordial pain   ?2. Primary hypertension   ?3. Pure hypercholesterolemia   ?4. BMI 39.0-39.9,adult   ?5. Essential hypertension   ? ?PLAN:   ? ?In order of problems listed above: ? ?Chest pain, risk factors hypertension, hyperlipidemia, diabetes, obesity.  Get echocardiogram, get coronary CTA. ?Hypertension, BP elevated.  Increase HCTZ to 25 mg daily, continue losartan 100 mg daily, Coreg 3.125.  If BP stays elevated, consider titrating dose of Coreg. ?  Hyperlipidemia, continue Crestor 5, lipid panel obtained by PCP today.  We will review. ?Obesity, low-calorie diet, weight loss advised. ? ?Follow-up after echocardiogram and coronary CTA. ? ?   ? ?Medication Adjustments/Labs and Tests Ordered: ?Current medicines are reviewed at length with the patient today.  Concerns regarding medicines are outlined above.  ?Orders Placed This Encounter  ?Procedures  ? CT CORONARY MORPH W/CTA COR W/SCORE W/CA W/CM &/OR WO/CM  ? EKG 12-Lead  ? ECHOCARDIOGRAM COMPLETE  ? ?Meds ordered this encounter  ?Medications  ? hydrochlorothiazide (HYDRODIURIL) 25 MG tablet  ?  Sig: Take 1 tablet (25 mg total) by mouth daily.  ?  Dispense:  30 tablet  ?  Refill:  5  ? metoprolol tartrate (LOPRESSOR) 100 MG tablet  ?  Sig: Take 1 tablet (100 mg total) by mouth once for 1 dose. Take 2 hours prior to your CT scan.  ?  Dispense:  1 tablet  ?  Refill:  0  ? ivabradine (CORLANOR) 5 MG TABS tablet  ?  Sig: Take 2 tablets (10 mg total) by mouth once for 1 dose. Take 2 hours prior to your CT Scan.  ?  Dispense:  2 tablet  ?  Refill:  0  ? ? ?Patient Instructions  ?Medication Instructions:  ? ?Your physician has recommended you make the following change in your medication:  ? ?INCREASE your Hydrochlorothiazide to 25 MG once a day. ? ? ?*If you need a refill on your cardiac medications before your next appointment, please call your pharmacy* ? ? ?Lab Work: ? ?None ordered ? ?If you have  labs (blood work) drawn today and your tests are completely normal, you will receive your results only by: ?MyChart Message (if you have MyChart) OR ?A paper copy in the mail ?If you have any lab test that is abnorm

## 2021-07-04 ENCOUNTER — Ambulatory Visit (INDEPENDENT_AMBULATORY_CARE_PROVIDER_SITE_OTHER): Payer: BC Managed Care – PPO

## 2021-07-04 DIAGNOSIS — R072 Precordial pain: Secondary | ICD-10-CM | POA: Diagnosis not present

## 2021-07-04 LAB — ECHOCARDIOGRAM COMPLETE
AR max vel: 3.45 cm2
AV Area VTI: 4.2 cm2
AV Area mean vel: 3.68 cm2
AV Mean grad: 3 mmHg
AV Peak grad: 6.7 mmHg
Ao pk vel: 1.29 m/s
Area-P 1/2: 2.81 cm2
Calc EF: 62 %
S' Lateral: 3.1 cm
Single Plane A2C EF: 62 %
Single Plane A4C EF: 64 %

## 2021-07-04 MED ORDER — PERFLUTREN LIPID MICROSPHERE
1.0000 mL | INTRAVENOUS | Status: AC | PRN
  Administered 2021-07-04: 2 mL via INTRAVENOUS

## 2021-07-06 ENCOUNTER — Telehealth (HOSPITAL_COMMUNITY): Payer: Self-pay | Admitting: Emergency Medicine

## 2021-07-06 ENCOUNTER — Telehealth (HOSPITAL_COMMUNITY): Payer: Self-pay | Admitting: *Deleted

## 2021-07-06 ENCOUNTER — Encounter: Payer: Self-pay | Admitting: Family

## 2021-07-06 NOTE — Telephone Encounter (Signed)
Reaching out to patient to offer assistance regarding upcoming cardiac imaging study; pt verbalizes understanding of appt date/time, parking situation and where to check in, pre-test NPO status and medications ordered, and verified current allergies; name and call back number provided for further questions should they arise  Garv Kuechle RN Navigator Cardiac Imaging [facility] [phone] office [phone] cell  Patient to take 100mg metoprolol tartrate and 10mg ivabradine two hours prior to his cardiac CT scan. 

## 2021-07-06 NOTE — Telephone Encounter (Signed)
Reaching out to patient to offer assistance regarding upcoming cardiac imaging study; pt verbalizes understanding of appt date/time, parking situation and where to check in, pre-test NPO status and medications ordered, and verified current allergies; name and call back number provided for further questions should they arise ?Rockwell Alexandria RN Navigator Cardiac Imaging ?[facility] ?[phone] office ?[phone] cell ? ?Denies iv issues ?100mg  metop + 10mg  ivab ?Arrival 800 ?

## 2021-07-07 ENCOUNTER — Ambulatory Visit
Admission: RE | Admit: 2021-07-07 | Discharge: 2021-07-07 | Disposition: A | Discharge status: Home / Self Care | Payer: BC Managed Care – PPO | Source: Ambulatory Visit | Attending: Cardiology | Admitting: Cardiology

## 2021-07-07 DIAGNOSIS — R072 Precordial pain: Secondary | ICD-10-CM

## 2021-07-07 MED ORDER — METOPROLOL TARTRATE 5 MG/5ML IV SOLN
10.0000 mg | Freq: Once | INTRAVENOUS | Status: AC
  Administered 2021-07-07: 10 mg via INTRAVENOUS

## 2021-07-07 MED ORDER — NITROGLYCERIN 0.4 MG SL SUBL
0.8000 mg | SUBLINGUAL_TABLET | Freq: Once | SUBLINGUAL | Status: AC
  Administered 2021-07-07: 0.8 mg via SUBLINGUAL

## 2021-07-07 MED ORDER — IOHEXOL 350 MG/ML SOLN
100.0000 mL | Freq: Once | INTRAVENOUS | Status: AC | PRN
  Administered 2021-07-07: 100 mL via INTRAVENOUS

## 2021-07-07 NOTE — Progress Notes (Signed)
Patient tolerated procedure well. Ambulate w/o difficulty. Denies any lightheadedness or being dizzy. Pt denies any pain at this time. Sitting in chair, drinking water provided. P is encouraged to drink additional water throughout the day and reason explained to patient. Patient verbalized understanding and all questions answered. ABC intact. No further needs at this time. Discharge from procedure area w/o issues.  °

## 2021-07-08 ENCOUNTER — Encounter: Payer: Self-pay | Admitting: Cardiology

## 2021-07-08 ENCOUNTER — Other Ambulatory Visit: Payer: Self-pay | Admitting: Family

## 2021-07-08 DIAGNOSIS — I10 Essential (primary) hypertension: Secondary | ICD-10-CM

## 2021-07-16 ENCOUNTER — Other Ambulatory Visit: Payer: Self-pay | Admitting: Family

## 2021-07-16 DIAGNOSIS — I10 Essential (primary) hypertension: Secondary | ICD-10-CM

## 2021-08-01 ENCOUNTER — Other Ambulatory Visit (INDEPENDENT_AMBULATORY_CARE_PROVIDER_SITE_OTHER): Payer: BC Managed Care – PPO

## 2021-08-01 DIAGNOSIS — I10 Essential (primary) hypertension: Secondary | ICD-10-CM

## 2021-08-01 LAB — BASIC METABOLIC PANEL
BUN: 19 mg/dL (ref 6–23)
CO2: 26 mEq/L (ref 19–32)
Calcium: 9.2 mg/dL (ref 8.4–10.5)
Chloride: 105 mEq/L (ref 96–112)
Creatinine, Ser: 0.96 mg/dL (ref 0.40–1.50)
GFR: 91.94 mL/min (ref >60.00)
Glucose, Bld: 150 mg/dL — ABNORMAL HIGH (ref 70–99)
Potassium: 4 mEq/L (ref 3.5–5.1)
Sodium: 141 mEq/L (ref 135–145)

## 2021-08-02 ENCOUNTER — Encounter: Payer: Self-pay | Admitting: Family

## 2021-08-04 ENCOUNTER — Ambulatory Visit (INDEPENDENT_AMBULATORY_CARE_PROVIDER_SITE_OTHER): Payer: BC Managed Care – PPO | Admitting: Adult Health

## 2021-08-04 ENCOUNTER — Encounter: Payer: Self-pay | Admitting: Adult Health

## 2021-08-04 VITALS — BP 142/80 | HR 90 | Temp 97.7°F | Ht 75.0 in | Wt 300.6 lb

## 2021-08-04 DIAGNOSIS — R0683 Snoring: Secondary | ICD-10-CM | POA: Diagnosis not present

## 2021-08-04 DIAGNOSIS — E669 Obesity, unspecified: Secondary | ICD-10-CM

## 2021-08-04 NOTE — Assessment & Plan Note (Signed)
Healthy weight loss discussed 

## 2021-08-04 NOTE — Progress Notes (Signed)
Reviewed and agree with assessment/plan.   Chesley Mires, MD [facility] 08/04/2021, 4:55 PM Pager:  [phone]

## 2021-08-04 NOTE — Progress Notes (Signed)
@Patient  ID: , male    DOB: 25-Jan-1971, 51 y.o.   MRN: [id number]  Chief Complaint  Patient presents with   sleep consult    Referring provider: [id number], FNP  HPI: Seen for sleep consult Jul 25, 2021 daytime sleepiness, restless sleep and snoring  TEST/EVENTS :  Cardiac CT 07/07/21 low risk study  Echo 07/04/21 nml  EF , Gr 1 DD .   08/04/2021 Sleep consult  Patient presents for a sleep consult today.  Patient was kindly referred by primary care provider 08/06/2021, FNP patient complains of daytime sleepiness, snoring and restless sleep.  Patient's been having difficult to control high blood pressure and primary care provider was concerned about possible underlying sleep apnea.  Patient typically goes to bed about 11:00 at night.  Takes about 5 to 10 minutes to go to sleep.  Is up to 3 times each night.  Gets up about 6:30 AM.  Does not operate heavy machinery at work.  Does not take sleep aids.  Caffeine intake 1 soda daily  Weight is up about 10 pounds current weight at 300 pounds with BMI at 37.57. No symptoms suspicious for cataplexy or sleep paralysis.  No history of stroke or congestive heart failure. No removable dental . Epworth score 8 out of 24, gets sleepy late in day with inactivity .   Medical history significant for hypertension, diabetes, morbid obesity, hyperlipidemia   Social history patient is married, has 2 children.  Lives at home.  Works as [employer].  Never smoker, no alcohol or drugs.  Family history is positive for heart disease, CVA   Surgical hx neg       No Known Allergies  Immunization History  Administered Date(s) Administered   Influenza Split 01/05/2012   Influenza Whole 12/06/2011   Influenza-Unspecified 01/18/2013, 12/12/2013, 01/09/2020, 11/18/2020   PFIZER(Purple Top)SARS-COV-2 Vaccination 06/10/2019, 07/01/2019, 01/19/2020   Tdap 09/01/2011    Past Medical History:  Diagnosis Date   Anxiety     on buspar and xanax in past   Diabetes (HCC)    Diverticulitis    GERD (gastroesophageal reflux disease)    uses zegrid occas   H/O cardiovascular stress test    normal per pt   High cholesterol    History of MRI of brain and brain stem    Normal per pt   Hypertension     Tobacco History: Social History   Tobacco Use  Smoking Status Never  Smokeless Tobacco Never   Counseling given: Not Answered   Outpatient Medications Prior to Visit  Medication Sig Dispense Refill   aspirin EC 81 MG tablet Take 1 tablet (81 mg total) by mouth daily. 30 tablet 11   carvedilol (COREG) 3.125 MG tablet TAKE 1 TABLET(3.125 MG) BY MOUTH TWICE DAILY WITH A MEAL 60 tablet 3   hydrochlorothiazide (HYDRODIURIL) 25 MG tablet Take 1 tablet (25 mg total) by mouth daily. 30 tablet 5   losartan (COZAAR) 100 MG tablet TAKE 1 TABLET(100 MG) BY MOUTH DAILY 90 tablet 3   metFORMIN (GLUCOPHAGE-XR) 500 MG 24 hr tablet Take 2 tablets (1,000 mg total) by mouth 2 (two) times daily. 120 tablet 2   Multiple Vitamin (MULTIVITAMIN) tablet Take 1 tablet by mouth daily.     pantoprazole (PROTONIX) 40 MG tablet TAKE 1 TABLET(40 MG) BY MOUTH DAILY ON AN EMPTY STOMACH 90 tablet 1   rosuvastatin (CRESTOR) 5 MG tablet Take 1 tablet (5 mg total) by mouth daily. 90  tablet 3   metoprolol tartrate (LOPRESSOR) 100 MG tablet Take 1 tablet (100 mg total) by mouth once for 1 dose. Take 2 hours prior to your CT scan. 1 tablet 0   No facility-administered medications prior to visit.     Review of Systems:   Constitutional:   No  weight loss, night sweats,  Fevers, chills, fatigue, or  lassitude.  HEENT:   No headaches,  Difficulty swallowing,  Tooth/dental problems, or  Sore throat,                No sneezing, itching, ear ache, nasal congestion, post nasal drip,   CV:  No chest pain,  Orthopnea, PND, swelling in lower extremities, anasarca, dizziness, palpitations, syncope.   GI  No heartburn, indigestion, abdominal pain,  nausea, vomiting, diarrhea, change in bowel habits, loss of appetite, bloody stools.   Resp: No shortness of breath with exertion or at rest.  No excess mucus, no productive cough,  No non-productive cough,  No coughing up of blood.  No change in color of mucus.  No wheezing.  No chest wall deformity  Skin: no rash or lesions.  GU: no dysuria, change in color of urine, no urgency or frequency.  No flank pain, no hematuria   MS:  No joint pain or swelling.  No decreased range of motion.  No back pain.    Physical Exam  BP (!) 142/80 (BP Location: Left Arm, Cuff Size: Large)   Pulse 90   Temp 97.7 F (36.5 C) (Temporal)   Ht 6\' 3"  (1.905 m)   Wt (!) 300 lb 9.6 oz (136.4 kg)   SpO2 98%   BMI 37.57 kg/m   GEN: A/Ox3; pleasant , NAD, well nourished    HEENT:  [place]/AT,   NOSE-clear, THROAT-clear, no lesions, no postnasal drip or exudate noted. Class 2- 3 MP airway   NECK:  Supple w/ fair ROM; no JVD; normal carotid impulses w/o bruits; no thyromegaly or nodules palpated; no lymphadenopathy.    RESP  Clear  P & A; w/o, wheezes/ rales/ or rhonchi. no accessory muscle use, no dullness to percussion  CARD:  RRR, no m/r/g, no peripheral edema, pulses intact, no cyanosis or clubbing.  GI:   Soft & nt; nml bowel sounds; no organomegaly or masses detected.   Musco: Warm bil, no deformities or joint swelling noted.   Neuro: alert, no focal deficits noted.    Skin: Warm, no lesions or rashes    Lab Results:  CBC    Component Value Date/Time   WBC 5.3 07/01/2021 0844   RBC 5.01 07/01/2021 0844   HGB 14.3 07/01/2021 0844   HCT 42.3 07/01/2021 0844   PLT 194.0 07/01/2021 0844   MCV 84.4 07/01/2021 0844   MCHC 33.8 07/01/2021 0844   RDW 13.9 07/01/2021 0844   LYMPHSABS 1.6 07/01/2021 0844   MONOABS 0.4 07/01/2021 0844   EOSABS 0.1 07/01/2021 0844   BASOSABS 0.0 07/01/2021 0844    BMET    Component Value Date/Time   NA 141 08/01/2021 0804   K 4.0 08/01/2021 0804   CL 105  08/01/2021 0804   CO2 26 08/01/2021 0804   GLUCOSE 150 (H) 08/01/2021 0804   BUN 19 08/01/2021 0804   CREATININE 0.96 08/01/2021 0804   CALCIUM 9.2 08/01/2021 0804    BNP No results found for: BNP  ProBNP No results found for: PROBNP  Imaging: CT CORONARY MORPH W/CTA COR W/SCORE W/CA W/CM &/OR WO/CM  Addendum Date:  07/07/2021   ADDENDUM REPORT: 07/07/2021 15:56 EXAM: OVER-READ INTERPRETATION  CT CHEST The following report is an over-read performed by radiologist Dr. Deberah Pelton [facility] on 07/07/2021. This over-read does not include interpretation of cardiac or coronary anatomy or pathology. The coronary calcium score/coronary CTA interpretation by the cardiologist is attached. COMPARISON:  None. FINDINGS: No suspicious nodules, masses, or infiltrates are identified in the visualized portion of the lungs. No pleural fluid seen. The visualized portions of the mediastinum and chest wall are unremarkable. IMPRESSION: No significant non-cardiac abnormality identified. Electronically Signed   By: Danae Orleans M.D.   On: 07/07/2021 15:56   Result Date: 07/07/2021 CLINICAL DATA:  Chest pain EXAM: Cardiac/Coronary  CTA TECHNIQUE: The patient was scanned on a Siemens Somatom go.Top scanner. : A retrospective scan was triggered in the descending thoracic aorta. Axial non-contrast 3 mm slices were carried out through the heart. The data set was analyzed on a dedicated work station and scored using the Agatson method. Gantry rotation speed was 330 msecs and collimation was .6 mm. 100mg  of metoprolol and 0.8 mg of sl NTG was given. The 3D data set was reconstructed in 5% intervals of the 60-95 % of the R-R cycle. Diastolic phases were analyzed on a dedicated work station using MPR, MIP and VRT modes. The patient received 100 cc of contrast. FINDINGS: Aorta:  Normal size.  Aortic root calcifications.  No dissection. Aortic Valve:  Trileaflet.  No calcifications. Coronary Arteries:  Normal  coronary origin.  Right dominance. RCA is a dominant artery that gives rise to PDA and PLA. There is no plaque. Left main is a large artery that gives rise to LAD and LCX arteries. There is no LM disease. LAD has no plaque. LCX is a non-dominant artery that gives rise to two obtuse marginal branches. There is no plaque. Other findings: Normal pulmonary vein drainage into the left atrium. Normal left atrial appendage without a thrombus. Normal size of the pulmonary artery. IMPRESSION: 1. Normal coronary calcium score of 0. Patient is a low risk for coronary events. 2. Normal coronary origin with right dominance. 3. No evidence of CAD. 4. CAD-RADS 0. No evidence of CAD (0%). Consider non-atherosclerotic causes of chest pain. Electronically Signed: By: M.D. On: 07/07/2021 14:42    perflutren lipid microspheres (DEFINITY) IV suspension     Date Action Dose Route User   07/04/2021 1552 Given 2 mL Intravenous Walker, Hollie           View : No data to display.          No results found for: NITRICOXIDE      Assessment & Plan:   Snoring Snoring, daytime sleepiness, restless sleep, BMI 37 all suspicious for underlying sleep apnea.  We will set up for home sleep study.  Healthy sleep regimen discussed.  - discussed how weight can impact sleep and risk for sleep disordered breathing - discussed options to assist with weight loss: combination of diet modification, cardiovascular and strength training exercises   - had an extensive discussion regarding the adverse health consequences related to untreated sleep disordered breathing - specifically discussed the risks for hypertension, coronary artery disease, cardiac dysrhythmias, cerebrovascular disease, and diabetes - lifestyle modification discussed   - discussed how sleep disruption can increase risk of accidents, particularly when driving - safe driving practices were discussed   Plan  Patient Instructions  Set up for  home sleep study Healthy sleep regimen Work on healthy weight Do  not drive if sleepy Follow-up in 6 to 8 weeks to discuss sleep study results and treatment plan      Obesity (BMI 30-39.9) Healthy weight loss discussed     Rubye Oaks, NP 08/04/2021

## 2021-08-04 NOTE — Assessment & Plan Note (Signed)
Snoring, daytime sleepiness, restless sleep, BMI 37 all suspicious for underlying sleep apnea.  We will set up for home sleep study.  Healthy sleep regimen discussed.  - discussed how weight can impact sleep and risk for sleep disordered breathing - discussed options to assist with weight loss: combination of diet modification, cardiovascular and strength training exercises   - had an extensive discussion regarding the adverse health consequences related to untreated sleep disordered breathing - specifically discussed the risks for hypertension, coronary artery disease, cardiac dysrhythmias, cerebrovascular disease, and diabetes - lifestyle modification discussed   - discussed how sleep disruption can increase risk of accidents, particularly when driving - safe driving practices were discussed   Plan  Patient Instructions  Set up for home sleep study Healthy sleep regimen Work on healthy weight Do not drive if sleepy Follow-up in 6 to 8 weeks to discuss sleep study results and treatment plan

## 2021-08-04 NOTE — Patient Instructions (Signed)
Set up for home sleep study Healthy sleep regimen Work on healthy weight Do not drive if sleepy Follow-up in 6 to 8 weeks to discuss sleep study results and treatment plan

## 2021-08-12 ENCOUNTER — Ambulatory Visit (INDEPENDENT_AMBULATORY_CARE_PROVIDER_SITE_OTHER): Payer: BC Managed Care – PPO | Admitting: Cardiology

## 2021-08-12 ENCOUNTER — Encounter: Payer: Self-pay | Admitting: Cardiology

## 2021-08-12 VITALS — BP 122/78 | HR 98 | Ht 75.0 in | Wt 302.0 lb

## 2021-08-12 DIAGNOSIS — Z6837 Body mass index (BMI) 37.0-37.9, adult: Secondary | ICD-10-CM

## 2021-08-12 DIAGNOSIS — I10 Essential (primary) hypertension: Secondary | ICD-10-CM | POA: Diagnosis not present

## 2021-08-12 DIAGNOSIS — E7800 Pure hypercholesterolemia, unspecified: Secondary | ICD-10-CM | POA: Diagnosis not present

## 2021-08-12 DIAGNOSIS — R072 Precordial pain: Secondary | ICD-10-CM | POA: Diagnosis not present

## 2021-08-12 NOTE — Progress Notes (Signed)
Cardiology Office Note:    Date:  08/12/2021   ID:  Todd Willis, DOB 02/06/1971, MRN [id number]  PCP:  Allegra Grana, FNP   [facility] Providers Cardiologist:  Debbe Odea, MD     Referring MD: Allegra Grana, FNP   No chief complaint on file.   History of Present Illness:    Todd Willis is a 51 y.o. male with a hx of hypertension, hyperlipidemia, diabetes who presents for follow-up.  Previously seen due to hypertension and chest pain.  Echocardiogram and coronary CTA was obtained to evaluate cardiac function.  Patient has a family history of heart disease with father and grandfather having history of heart attacks.  HCTZ was increased to 25 mg daily, BP has been controlled.  Also takes losartan, Coreg.  Presents for testing results.  Has appointment to see sleep specialist due to history of snoring.  He has been eating healthier, lost some weight, states feeling better with weight loss.  Has no concerns at this time.   Past Medical History:  Diagnosis Date   Anxiety    on buspar and xanax in past   Diabetes (HCC)    Diverticulitis    GERD (gastroesophageal reflux disease)    uses zegrid occas   H/O cardiovascular stress test    normal per pt   High cholesterol    History of MRI of brain and brain stem    Normal per pt   Hypertension     Past Surgical History:  Procedure Laterality Date   NO PAST SURGERIES      Current Medications: Current Meds  Medication Sig   aspirin EC 81 MG tablet Take 1 tablet (81 mg total) by mouth daily.   carvedilol (COREG) 3.125 MG tablet TAKE 1 TABLET(3.125 MG) BY MOUTH TWICE DAILY WITH A MEAL   hydrochlorothiazide (HYDRODIURIL) 25 MG tablet Take 1 tablet (25 mg total) by mouth daily.   losartan (COZAAR) 100 MG tablet TAKE 1 TABLET(100 MG) BY MOUTH DAILY   metFORMIN (GLUCOPHAGE-XR) 500 MG 24 hr tablet Take 2 tablets (1,000 mg total) by mouth 2 (two) times daily.   Multiple Vitamin (MULTIVITAMIN) tablet Take 1  tablet by mouth daily.   pantoprazole (PROTONIX) 40 MG tablet TAKE 1 TABLET(40 MG) BY MOUTH DAILY ON AN EMPTY STOMACH   rosuvastatin (CRESTOR) 5 MG tablet Take 1 tablet (5 mg total) by mouth daily.     Allergies:   Patient has no known allergies.   Social History   Socioeconomic History   Marital status: Married    Spouse name: Vilas Edgerly   Number of children: 2   Years of education: BS   Highest education level: Not on file  Occupational History   Occupation: [employer],[place]  Tobacco Use   Smoking status: Never   Smokeless tobacco: Never  Substance and Sexual Activity   Alcohol use: No   Drug use: No   Sexual activity: Not on file  Other Topics Concern   Not on file  Social History Narrative   Right handed   Lives in [place], works in [place].      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, long hours   Exercise - elliptical 3x per week   Diet - low carbohydrate   Social Determinants of Corporate investment banker Strain: Not on file  Food Insecurity: Not on file  Transportation Needs: Not on file  Physical Activity: Not on file  Stress:  Not on file  Social Connections: Not on file     Family History: The patient's family history includes Aneurysm in his maternal grandfather and maternal grandmother; Depression in his maternal grandfather and maternal grandmother; Diabetes in his father; Heart attack (age of onset: 6965) in his paternal grandfather; Heart disease in his father, maternal grandfather, and maternal grandmother; Heart disease (age of onset: 2973) in his mother; Hyperlipidemia in his father; Hypertension in his father, maternal grandfather, maternal grandmother, and mother; Stroke (age of onset: 5565) in his father.  ROS:   Please see the history of present illness.     All other systems reviewed and are negative.  EKGs/Labs/Other Studies Reviewed:    The following studies were reviewed today:   EKG:  EKG not ordered today.    Recent Labs: 07/01/2021: ALT 41; Hemoglobin 14.3; Platelets 194.0 08/01/2021: BUN 19; Creatinine, Ser 0.96; Potassium 4.0; Sodium 141  Recent Lipid Panel    Component Value Date/Time   CHOL 146 07/01/2021 0844   TRIG 177.0 (H) 07/01/2021 0844   HDL 36.10 (L) 07/01/2021 0844   CHOLHDL 4 07/01/2021 0844   VLDL 35.4 07/01/2021 0844   LDLCALC 74 07/01/2021 0844   LDLDIRECT 137.0 10/14/2014 0904     Risk Assessment/Calculations:          Physical Exam:    VS:  BP 122/78   Pulse 98   Ht 6\' 3"  (1.905 m)   Wt (!) 302 lb (137 kg)   SpO2 98%   BMI 37.75 kg/m     Wt Readings from Last 3 Encounters:  08/12/21 (!) 302 lb (137 kg)  08/04/21 (!) 300 lb 9.6 oz (136.4 kg)  07/01/21 (!) 312 lb 6 oz (141.7 kg)     GEN:  Well nourished, well developed in no acute distress HEENT: Normal NECK: No JVD; No carotid bruits LYMPHATICS: No lymphadenopathy CARDIAC: RRR, no murmurs, rubs, gallops RESPIRATORY:  Clear to auscultation without rales, wheezing or rhonchi  ABDOMEN: Soft, non-tender, non-distended MUSCULOSKELETAL:  No edema; No deformity  SKIN: Warm and dry NEUROLOGIC:  Alert and oriented x 3 PSYCHIATRIC:  Normal affect   ASSESSMENT:    1. Precordial pain   2. Primary hypertension   3. Pure hypercholesterolemia   4. BMI 37.0-37.9, adult     PLAN:    In order of problems listed above:  Chest pain, echocardiogram showed normal systolic function, EF 60 to 65%, impaired relaxation.  Coronary CTA with no CAD.  Calcium score 0.  Patient made aware of results, reassured.  Aortic root calcification noted, patient on aspirin, statin. Hypertension, BP controlled.  Continue HCTZ 25 mg daily,  losartan 100 mg daily, Coreg 3.125.   Hyperlipidemia, total and LDL cholesterol controlled, triglycerides elevated.  Continue Crestor 5 mg daily. Obesity, congratulated on eating healthier, and losing weight.  Continue low-calorie diet, and exercising.  Follow-up as needed.       Medication Adjustments/Labs and Tests Ordered: Current medicines are reviewed at length with the patient today.  Concerns regarding medicines are outlined above.  No orders of the defined types were placed in this encounter.  No orders of the defined types were placed in this encounter.   Patient Instructions  Medication Instructions:  Your physician recommends that you continue on your current medications as directed. Please refer to the Current Medication list given to you today.  *If you need a refill on your cardiac medications before your next appointment, please call your pharmacy*   Lab Work: None  ordered If you have labs (blood work) drawn today and your tests are completely normal, you will receive your results only by: MyChart Message (if you have MyChart) OR A paper copy in the mail If you have any lab test that is abnormal or we need to change your treatment, we will call you to review the results.   Testing/Procedures: None ordered   Follow-Up: At [facility], you and your health needs are our priority.  As part of our continuing mission to provide you with exceptional heart care, we have created designated Provider Care Teams.  These Care Teams include your primary Cardiologist (physician) and Advanced Practice Providers (APPs -  Physician Assistants and Nurse Practitioners) who all work together to provide you with the care you need, when you need it.  We recommend signing up for the patient portal called "MyChart".  Sign up information is provided on this After Visit Summary.  MyChart is used to connect with patients for Virtual Visits (Telemedicine).  Patients are able to view lab/test results, encounter notes, upcoming appointments, etc.  Non-urgent messages can be sent to your provider as well.   To learn more about what you can do with MyChart, go to [email or web address].    Your next appointment:   Follow up as needed   The format for your next  appointment:   In Person  Provider:   You may see Debbe Odea, MD or one of the following Advanced Practice Providers on your designated Care Team:   Nicolasa Ducking, NP Eula Listen, PA-C Cadence Fransico Michael, [place]    Other Instructions   Important Information About Sugar         Signed, Debbe Odea, MD  08/12/2021 5:07 PM    [facility]

## 2021-08-12 NOTE — Patient Instructions (Signed)
Medication Instructions:  ? ?Your physician recommends that you continue on your current medications as directed. Please refer to the Current Medication list given to you today. ? ?*If you need a refill on your cardiac medications before your next appointment, please call your pharmacy* ? ? ?Lab Work: ? ?None ordered ? ?If you have labs (blood work) drawn today and your tests are completely normal, you will receive your results only by: ?MyChart Message (if you have MyChart) OR ?A paper copy in the mail ?If you have any lab test that is abnormal or we need to change your treatment, we will call you to review the results. ? ? ?Testing/Procedures: ? ?None ordered ? ? ?Follow-Up: ?At [facility], you and your health needs are our priority.  As part of our continuing mission to provide you with exceptional heart care, we have created designated Provider Care Teams.  These Care Teams include your primary Cardiologist (physician) and Advanced Practice Providers (APPs -  Physician Assistants and Nurse Practitioners) who all work together to provide you with the care you need, when you need it. ? ?We recommend signing up for the patient portal called "MyChart".  Sign up information is provided on this After Visit Summary.  MyChart is used to connect with patients for Virtual Visits (Telemedicine).  Patients are able to view lab/test results, encounter notes, upcoming appointments, etc.  Non-urgent messages can be sent to your provider as well.   ?To learn more about what you can do with MyChart, go to [email or web address].   ? ?Your next appointment:   ? ?Follow up as needed  ? ?The format for your next appointment:   ?In Person ? ?Provider:   ?You may see Brian Agbor-Etang, MD or one of the following Advanced Practice Providers on your designated Care Team:   ?Christopher Berge, NP ?Ryan Dunn, PA-C ?Cadence Furth, PA-C  ? ? ?Other Instructions ? ? ?Important Information About Sugar ? ? ? ? ? ? ?

## 2021-08-25 ENCOUNTER — Encounter: Payer: Self-pay | Admitting: Family

## 2021-08-28 ENCOUNTER — Other Ambulatory Visit: Payer: Self-pay | Admitting: Family

## 2021-08-28 DIAGNOSIS — I10 Essential (primary) hypertension: Secondary | ICD-10-CM

## 2021-09-16 DIAGNOSIS — L03116 Cellulitis of left lower limb: Secondary | ICD-10-CM | POA: Diagnosis not present

## 2021-09-23 ENCOUNTER — Telehealth: Payer: Self-pay | Admitting: Adult Health

## 2021-09-23 NOTE — Telephone Encounter (Signed)
Spoke to patient. He stated that she is scheduled for sleep study today and ROV 10/04/2021. He is wanting to verify that sleep study would be read before appt. He is aware that out office will contact the day prior to reschedule if sleep study is not read by MD. OV changed to mychart visit per patient request.  Nothing further needed.

## 2021-09-28 ENCOUNTER — Ambulatory Visit: Payer: BC Managed Care – PPO

## 2021-09-28 DIAGNOSIS — G4733 Obstructive sleep apnea (adult) (pediatric): Secondary | ICD-10-CM

## 2021-09-28 DIAGNOSIS — R0683 Snoring: Secondary | ICD-10-CM

## 2021-09-29 DIAGNOSIS — G4733 Obstructive sleep apnea (adult) (pediatric): Secondary | ICD-10-CM | POA: Diagnosis not present

## 2021-10-04 ENCOUNTER — Telehealth (INDEPENDENT_AMBULATORY_CARE_PROVIDER_SITE_OTHER): Payer: BC Managed Care – PPO | Admitting: Adult Health

## 2021-10-04 ENCOUNTER — Encounter: Payer: Self-pay | Admitting: Adult Health

## 2021-10-04 DIAGNOSIS — G4733 Obstructive sleep apnea (adult) (pediatric): Secondary | ICD-10-CM | POA: Diagnosis not present

## 2021-10-04 NOTE — Progress Notes (Signed)
Virtual Visit via Video Note  I connected with Todd Willis on 10/04/21 at 12:00 PM EDT by a video enabled telemedicine application and verified that I am speaking with the correct person using two identifiers.  Location: Patient: Home  Provider: Office    I discussed the limitations of evaluation and management by telemedicine and the availability of in person appointments. The patient expressed understanding and agreed to proceed.  History of Present Illness: 51 year old male seen for sleep consult Aug 04, 2021 for daytime sleepiness, restless sleep and snoring found to have mild obstructive sleep apnea Today's video visit is a follow-up visit to review sleep study results.  Patient was seen last visit for sleep consult with snoring and daytime sleepiness.  Was set up for home sleep study that was completed on September 28, 2021 that showed mild sleep apnea with AHI at 8.5/hour and SPO2 low at 83%. We reviewed his sleep study results and went over in detail.  We went over treatment plans including weight loss oral appliance and CPAP therapy.  Patient would like to proceed with  Past Medical History:  Diagnosis Date   Anxiety    on buspar and xanax in past   Diabetes (HCC)    Diverticulitis    GERD (gastroesophageal reflux disease)    uses zegrid occas   H/O cardiovascular stress test    normal per pt   High cholesterol    History of MRI of brain and brain stem    Normal per pt   Hypertension    Current Outpatient Medications on File Prior to Visit  Medication Sig Dispense Refill   aspirin EC 81 MG tablet Take 1 tablet (81 mg total) by mouth daily. 30 tablet 11   carvedilol (COREG) 3.125 MG tablet TAKE 1 TABLET(3.125 MG) BY MOUTH TWICE DAILY WITH A MEAL 60 tablet 3   hydrochlorothiazide (HYDRODIURIL) 25 MG tablet Take 1 tablet (25 mg total) by mouth daily. 30 tablet 5   losartan (COZAAR) 100 MG tablet TAKE 1 TABLET(100 MG) BY MOUTH DAILY 90 tablet 3   metFORMIN (GLUCOPHAGE-XR) 500  MG 24 hr tablet Take 2 tablets (1,000 mg total) by mouth 2 (two) times daily. 120 tablet 2   Multiple Vitamin (MULTIVITAMIN) tablet Take 1 tablet by mouth daily.     pantoprazole (PROTONIX) 40 MG tablet TAKE 1 TABLET(40 MG) BY MOUTH DAILY ON AN EMPTY STOMACH 90 tablet 1   rosuvastatin (CRESTOR) 5 MG tablet Take 1 tablet (5 mg total) by mouth daily. 90 tablet 3   No current facility-administered medications on file prior to visit.      Observations/Objective: Appears in no acute distress  Assessment and Plan: Mild obstructive sleep apnea.  We discussed sleep study results in detail.  Went over treatment options including positional sleep, oral appliance, weight loss and CPAP therapy.  Patient wants to think over his options and do some research.  He wants to call us back with his decision.  He is deciding between CPAP and oral appliance.  Plan  Patient Instructions  Call back once you have decided on oral appliance or CPAP therapy. Healthy sleep regimen Work on healthy weight Do not drive if sleepy  Follow up as discussed once you have made your decision      Follow Up Instructions:    I discussed the assessment and treatment plan with the patient. The patient was provided an opportunity to ask questions and all were answered. The patient agreed with the plan and demonstrated  an understanding of the instructions.   The patient was advised to call back or seek an in-person evaluation if the symptoms worsen or if the condition fails to improve as anticipated.  I provided 22 minutes of non-face-to-face time during this encounter.   Rubye Oaks, NP

## 2021-10-04 NOTE — Patient Instructions (Signed)
Call back once you have decided on oral appliance or CPAP therapy. Healthy sleep regimen Work on healthy weight Do not drive if sleepy  Follow up as discussed once you have made your decision

## 2021-10-04 NOTE — Progress Notes (Signed)
Reviewed and agree with assessment/plan.   Farida Mcreynolds, MD [facility] 10/04/2021, 1:33 PM Pager:  [phone]  

## 2021-10-11 ENCOUNTER — Telehealth: Payer: Self-pay | Admitting: Adult Health

## 2021-10-11 DIAGNOSIS — R0683 Snoring: Secondary | ICD-10-CM

## 2021-10-11 DIAGNOSIS — G4733 Obstructive sleep apnea (adult) (pediatric): Secondary | ICD-10-CM

## 2021-10-11 NOTE — Telephone Encounter (Signed)
Yes please start on CPAP 5 to 15 cm H2O, airview, mask of choice  Ov in 3 months

## 2021-10-11 NOTE — Telephone Encounter (Signed)
Called patient and he states he would like to start cpap therapy.   Can I get the setting tammy and gt the order sent in  Thank you   Please advise

## 2021-10-13 NOTE — Telephone Encounter (Signed)
Called and spoke with patient. Patient is aware of the cpap order. Patient verbalized understanding. Cpap order has been placed.   Patient stated that he didn't have his calender around him at the time to schedule a 3 month f/u and that he will call us back on 07/15/2021.   Nothing further needed.

## 2021-10-17 ENCOUNTER — Telehealth: Payer: Self-pay | Admitting: Family

## 2021-10-17 ENCOUNTER — Other Ambulatory Visit: Payer: Self-pay | Admitting: Family

## 2021-10-17 DIAGNOSIS — E119 Type 2 diabetes mellitus without complications: Secondary | ICD-10-CM

## 2021-10-17 DIAGNOSIS — K219 Gastro-esophageal reflux disease without esophagitis: Secondary | ICD-10-CM

## 2021-10-17 NOTE — Telephone Encounter (Signed)
Pt called stating the provider wanted to check his blood work in three months because she changed his medication in April, but there are no orders  put in

## 2021-10-18 ENCOUNTER — Other Ambulatory Visit: Payer: Self-pay

## 2021-10-18 DIAGNOSIS — Z125 Encounter for screening for malignant neoplasm of prostate: Secondary | ICD-10-CM

## 2021-10-18 DIAGNOSIS — I10 Essential (primary) hypertension: Secondary | ICD-10-CM

## 2021-10-18 DIAGNOSIS — Z0000 Encounter for general adult medical examination without abnormal findings: Secondary | ICD-10-CM

## 2021-10-18 DIAGNOSIS — E119 Type 2 diabetes mellitus without complications: Secondary | ICD-10-CM

## 2021-10-18 DIAGNOSIS — E559 Vitamin D deficiency, unspecified: Secondary | ICD-10-CM

## 2021-10-18 DIAGNOSIS — E785 Hyperlipidemia, unspecified: Secondary | ICD-10-CM

## 2021-10-18 NOTE — Telephone Encounter (Signed)
Labs ordered.

## 2021-10-24 ENCOUNTER — Other Ambulatory Visit (INDEPENDENT_AMBULATORY_CARE_PROVIDER_SITE_OTHER): Payer: BC Managed Care – PPO

## 2021-10-24 DIAGNOSIS — I10 Essential (primary) hypertension: Secondary | ICD-10-CM | POA: Diagnosis not present

## 2021-10-24 DIAGNOSIS — Z0000 Encounter for general adult medical examination without abnormal findings: Secondary | ICD-10-CM

## 2021-10-24 DIAGNOSIS — Z8639 Personal history of other endocrine, nutritional and metabolic disease: Secondary | ICD-10-CM | POA: Diagnosis not present

## 2021-10-24 DIAGNOSIS — Z125 Encounter for screening for malignant neoplasm of prostate: Secondary | ICD-10-CM | POA: Diagnosis not present

## 2021-10-24 DIAGNOSIS — E119 Type 2 diabetes mellitus without complications: Secondary | ICD-10-CM

## 2021-10-24 LAB — COMPREHENSIVE METABOLIC PANEL
ALT: 36 U/L (ref 0–53)
AST: 25 U/L (ref 0–37)
Albumin: 4.4 g/dL (ref 3.5–5.2)
Alkaline Phosphatase: 47 U/L (ref 39–117)
BUN: 20 mg/dL (ref 6–23)
CO2: 29 mEq/L (ref 19–32)
Calcium: 9.5 mg/dL (ref 8.4–10.5)
Chloride: 98 mEq/L (ref 96–112)
Creatinine, Ser: 1.04 mg/dL (ref 0.40–1.50)
GFR: 83.39 mL/min (ref >60.00)
Glucose, Bld: 169 mg/dL — ABNORMAL HIGH (ref 70–99)
Potassium: 4.4 mEq/L (ref 3.5–5.1)
Sodium: 141 mEq/L (ref 135–145)
Total Bilirubin: 0.7 mg/dL (ref 0.2–1.2)
Total Protein: 6.8 g/dL (ref 6.0–8.3)

## 2021-10-24 LAB — PSA: PSA: 0.29 ng/mL (ref 0.10–4.00)

## 2021-10-24 LAB — HEMOGLOBIN A1C: Hgb A1c MFr Bld: 7.4 % — ABNORMAL HIGH (ref 4.6–6.5)

## 2021-10-24 LAB — TSH: TSH: 1.91 u[IU]/mL (ref 0.35–5.50)

## 2021-10-24 LAB — VITAMIN D 25 HYDROXY (VIT D DEFICIENCY, FRACTURES): VITD: 20.9 ng/mL — ABNORMAL LOW (ref 30.00–100.00)

## 2021-11-01 DIAGNOSIS — G4733 Obstructive sleep apnea (adult) (pediatric): Secondary | ICD-10-CM | POA: Diagnosis not present

## 2021-12-02 DIAGNOSIS — G4733 Obstructive sleep apnea (adult) (pediatric): Secondary | ICD-10-CM | POA: Diagnosis not present

## 2021-12-06 DIAGNOSIS — G4733 Obstructive sleep apnea (adult) (pediatric): Secondary | ICD-10-CM | POA: Diagnosis not present

## 2021-12-27 ENCOUNTER — Ambulatory Visit (INDEPENDENT_AMBULATORY_CARE_PROVIDER_SITE_OTHER): Payer: BC Managed Care – PPO | Admitting: Family

## 2021-12-27 ENCOUNTER — Encounter: Payer: Self-pay | Admitting: Family

## 2021-12-27 ENCOUNTER — Other Ambulatory Visit: Payer: Self-pay

## 2021-12-27 VITALS — BP 126/80 | HR 83 | Temp 98.6°F | Ht 75.0 in | Wt 305.2 lb

## 2021-12-27 DIAGNOSIS — Z23 Encounter for immunization: Secondary | ICD-10-CM | POA: Diagnosis not present

## 2021-12-27 DIAGNOSIS — I10 Essential (primary) hypertension: Secondary | ICD-10-CM | POA: Diagnosis not present

## 2021-12-27 DIAGNOSIS — E119 Type 2 diabetes mellitus without complications: Secondary | ICD-10-CM | POA: Diagnosis not present

## 2021-12-27 MED ORDER — METFORMIN HCL ER 500 MG PO TB24: ORAL_TABLET | ORAL | 2 refills | Status: DC

## 2021-12-27 NOTE — Patient Instructions (Addendum)
Lets consider either ozempic ( injectable once per week) or jardiance ( oral daily)

## 2021-12-27 NOTE — Progress Notes (Signed)
Subjective:    Patient ID: Todd Willis, male    DOB: 1970/08/25, 51 y.o.   MRN: [id number]  CC: Todd Willis is a 51 y.o. male who presents today for follow up.   HPI: Feels well today.  No new complaints    HTN- compliant with HCTZ 25 mg daily,  losartan 100 mg daily, Coreg 3.125 mg BID.   DM- compliant metformin 1000 mg BID   A1c 7.4  Consult with cardiology 08/12/2021 regarding chest pain, echocardiogram.  Patient to remain on aspirin, statin.  Follow-up as needed   HISTORY:  Past Medical History:  Diagnosis Date   Anxiety    on buspar and xanax in past   Diabetes ([place])    Diverticulitis    GERD (gastroesophageal reflux disease)    uses zegrid occas   H/O cardiovascular stress test    normal per pt   High cholesterol    History of MRI of brain and brain stem    Normal per pt   Hypertension    Past Surgical History:  Procedure Laterality Date   NO PAST SURGERIES     Family History  Problem Relation Age of Onset   Hypertension Mother    Heart disease Mother 61       pacemaker   Hyperlipidemia Father    Heart disease Father    Stroke Father 85   Hypertension Father    Diabetes Father    Heart disease Maternal Grandmother    Hypertension Maternal Grandmother    Depression Maternal Grandmother    Aneurysm Maternal Grandmother        thought to be brain   Heart disease Maternal Grandfather    Hypertension Maternal Grandfather    Depression Maternal Grandfather    Aneurysm Maternal Grandfather        thought to be brain   Heart attack Paternal Grandfather 44    Allergies: Patient has no known allergies. Current Outpatient Medications on File Prior to Visit  Medication Sig Dispense Refill   aspirin EC 81 MG tablet Take 1 tablet (81 mg total) by mouth daily. 30 tablet 11   carvedilol (COREG) 3.125 MG tablet TAKE 1 TABLET(3.125 MG) BY MOUTH TWICE DAILY WITH A MEAL 60 tablet 3   hydrochlorothiazide (HYDRODIURIL) 25 MG tablet Take 1 tablet (25 mg  total) by mouth daily. 30 tablet 5   losartan (COZAAR) 100 MG tablet TAKE 1 TABLET(100 MG) BY MOUTH DAILY 90 tablet 3   Multiple Vitamin (MULTIVITAMIN) tablet Take 1 tablet by mouth daily.     pantoprazole (PROTONIX) 40 MG tablet TAKE 1 TABLET(40 MG) BY MOUTH DAILY ON AN EMPTY STOMACH 90 tablet 1   rosuvastatin (CRESTOR) 5 MG tablet Take 1 tablet (5 mg total) by mouth daily. 90 tablet 3   No current facility-administered medications on file prior to visit.    Social History   Tobacco Use   Smoking status: Never   Smokeless tobacco: Never  Substance Use Topics   Alcohol use: No   Drug use: No    Review of Systems  Constitutional:  Negative for chills and fever.  Respiratory:  Negative for cough.   Cardiovascular:  Negative for chest pain and palpitations.  Gastrointestinal:  Negative for nausea and vomiting.      Objective:    BP 126/80 (BP Location: Left Arm, Patient Position: Sitting, Cuff Size: Normal)   Pulse 83   Temp 98.6 F (37 C) (Oral)   Ht 6\' 3"  (1.905 m)  Wt (!) 305 lb 3.2 oz (138.4 kg)   SpO2 96%   BMI 38.15 kg/m  BP Readings from Last 3 Encounters:  12/27/21 126/80  08/12/21 122/78  08/04/21 (!) 142/80   Wt Readings from Last 3 Encounters:  12/27/21 (!) 305 lb 3.2 oz (138.4 kg)  08/12/21 (!) 302 lb (137 kg)  08/04/21 (!) 300 lb 9.6 oz (136.4 kg)    Physical Exam Vitals reviewed.  Constitutional:      Appearance: He is well-developed.  Cardiovascular:     Rate and Rhythm: Regular rhythm.     Heart sounds: Normal heart sounds.  Pulmonary:     Effort: Pulmonary effort is normal. No respiratory distress.     Breath sounds: Normal breath sounds. No wheezing, rhonchi or rales.  Skin:    General: Skin is warm and dry.  Neurological:     Mental Status: He is alert.  Psychiatric:        Speech: Speech normal.        Behavior: Behavior normal.        Assessment & Plan:   Problem List Items Addressed This Visit       Cardiovascular and  Mediastinum   Essential hypertension    Chronic, stable. Continue HCTZ 25 mg daily,  losartan 100 mg daily, Coreg 3.125 mg BID.        Endocrine   Diabetes mellitus without complication (HCC) - Primary    Lab Results  Component Value Date   HGBA1C 7.4 (H) 10/24/2021  Uncontrolled. We discussed jardiance versus ozempic. Patient will consider these. Continue metformin 1000mg  bid.       Relevant Orders   Hemoglobin A1c   Other Visit Diagnoses     Need for Tdap vaccination       Relevant Orders   Tdap vaccine greater than or equal to 7yo IM (Completed)        I am having "Todd Willis" maintain his multivitamin, aspirin EC, rosuvastatin, hydrochlorothiazide, losartan, carvedilol, and pantoprazole.   No orders of the defined types were placed in this encounter.   Return precautions given.   Risks, benefits, and alternatives of the medications and treatment plan prescribed today were discussed, and patient expressed understanding.   Education regarding symptom management and diagnosis given to patient on AVS.  Continue to follow with [birth year]-1974, FNP for routine health maintenance.   Allegra Grana and I agreed with plan.   Todd Devon, FNP

## 2021-12-27 NOTE — Assessment & Plan Note (Signed)
Chronic, stable. Continue HCTZ 25 mg daily,  losartan 100 mg daily, Coreg 3.125 mg BID. 

## 2021-12-27 NOTE — Assessment & Plan Note (Signed)
Lab Results  Component Value Date   HGBA1C 7.4 (H) 10/24/2021   Uncontrolled. We discussed jardiance versus ozempic. Patient will consider these. Continue metformin 1000mg  bid.

## 2022-01-01 DIAGNOSIS — G4733 Obstructive sleep apnea (adult) (pediatric): Secondary | ICD-10-CM | POA: Diagnosis not present

## 2022-01-02 ENCOUNTER — Other Ambulatory Visit: Payer: Self-pay

## 2022-01-02 MED ORDER — HYDROCHLOROTHIAZIDE 25 MG PO TABS: 25.0000 mg | ORAL_TABLET | Freq: Every day | ORAL | 5 refills | Status: DC

## 2022-01-05 ENCOUNTER — Other Ambulatory Visit: Payer: Self-pay | Admitting: Family

## 2022-01-05 DIAGNOSIS — I10 Essential (primary) hypertension: Secondary | ICD-10-CM

## 2022-01-05 DIAGNOSIS — G4733 Obstructive sleep apnea (adult) (pediatric): Secondary | ICD-10-CM | POA: Diagnosis not present

## 2022-01-16 ENCOUNTER — Encounter: Payer: Self-pay | Admitting: Adult Health

## 2022-01-16 ENCOUNTER — Telehealth (INDEPENDENT_AMBULATORY_CARE_PROVIDER_SITE_OTHER): Payer: BC Managed Care – PPO | Admitting: Adult Health

## 2022-01-16 DIAGNOSIS — G4733 Obstructive sleep apnea (adult) (pediatric): Secondary | ICD-10-CM | POA: Diagnosis not present

## 2022-01-16 DIAGNOSIS — E669 Obesity, unspecified: Secondary | ICD-10-CM

## 2022-01-16 NOTE — Progress Notes (Signed)
Virtual Visit via Video Note  I connected with Todd Willis on 01/16/22 at  3:30 PM EDT by a video enabled telemedicine application and verified that I am speaking with the correct person using two identifiers.  Location: Patient: Home  Provider: Office    I discussed the limitations of evaluation and management by telemedicine and the availability of in person appointments. The patient expressed understanding and agreed to proceed.  History of Present Illness: 51 yo male seen for sleep consult 08/14/21 found to have mild OSA   Today's video visit is to follow up on OSA . Last office visit reviewed sleep study results that showed mild OSA. He started CPAP last office visit. Says he starting to get used to it. Wears it all night long. Does aggravate his Rosacea and cause dry mouth . Switched from nasal mask to dream wear full face. Is some better. Feels less tired in am . Still has tiredness in evening hours.  CPAP download shows 100% compliance with avg usage at 5.5 hrs. On auto cpap at 5-15cm, AHI 3/hr .   Past Medical History:  Diagnosis Date   Anxiety    on buspar and xanax in past   Diabetes (HCC)    Diverticulitis    GERD (gastroesophageal reflux disease)    uses zegrid occas   H/O cardiovascular stress test    normal per pt   High cholesterol    History of MRI of brain and brain stem    Normal per pt   Hypertension     Current Outpatient Medications on File Prior to Visit  Medication Sig Dispense Refill   aspirin EC 81 MG tablet Take 1 tablet (81 mg total) by mouth daily. 30 tablet 11   carvedilol (COREG) 3.125 MG tablet TAKE 1 TABLET(3.125 MG) BY MOUTH TWICE DAILY WITH A MEAL 60 tablet 3   hydrochlorothiazide (HYDRODIURIL) 25 MG tablet Take 1 tablet (25 mg total) by mouth daily. 30 tablet 5   losartan (COZAAR) 100 MG tablet TAKE 1 TABLET(100 MG) BY MOUTH DAILY 90 tablet 3   metFORMIN (GLUCOPHAGE-XR) 500 MG 24 hr tablet TAKE 2 TABLETS(1000 MG) BY MOUTH TWICE DAILY 120  tablet 2   Multiple Vitamin (MULTIVITAMIN) tablet Take 1 tablet by mouth daily.     pantoprazole (PROTONIX) 40 MG tablet TAKE 1 TABLET(40 MG) BY MOUTH DAILY ON AN EMPTY STOMACH 90 tablet 1   rosuvastatin (CRESTOR) 5 MG tablet Take 1 tablet (5 mg total) by mouth daily. 90 tablet 3   No current facility-administered medications on file prior to visit.      Observations/Objective: September 28, 2021 that showed mild sleep apnea with AHI at 8.5/hour and SPO2 low at 83%.  Assessment and Plan: Mild OSA - continue on CPAP At bedtime    Plan  Patient Instructions  Continue on  CPAP At bedtime  , wear all night long .  Healthy sleep regimen Work on healthy weight Do not drive if sleepy  Follow up in 6 months and As needed       Follow Up Instructions:    I discussed the assessment and treatment plan with the patient. The patient was provided an opportunity to ask questions and all were answered. The patient agreed with the plan and demonstrated an understanding of the instructions.   The patient was advised to call back or seek an in-person evaluation if the symptoms worsen or if the condition fails to improve as anticipated.  I provided 22  minutes  of non-face-to-face time during this encounter.   Rexene Edison, NP

## 2022-01-16 NOTE — Patient Instructions (Addendum)
Continue on  CPAP At bedtime  , wear all night long .  Healthy sleep regimen Work on healthy weight Do not drive if sleepy  Follow up in 6 months and As needed

## 2022-01-17 ENCOUNTER — Encounter: Payer: Self-pay | Admitting: Family

## 2022-01-17 NOTE — Progress Notes (Signed)
Reviewed and agree with assessment/plan.   Eliah Marquard, MD [facility] 01/17/2022, 10:35 AM Pager:  [phone]  

## 2022-01-24 NOTE — Telephone Encounter (Signed)
Labs ordered   A1c associated with DM TSH associated with HTN Vitamin D associated with h/o vit D deficiency

## 2022-01-27 ENCOUNTER — Other Ambulatory Visit (INDEPENDENT_AMBULATORY_CARE_PROVIDER_SITE_OTHER): Payer: BC Managed Care – PPO

## 2022-01-27 ENCOUNTER — Encounter: Payer: Self-pay | Admitting: Family

## 2022-01-27 DIAGNOSIS — E119 Type 2 diabetes mellitus without complications: Secondary | ICD-10-CM | POA: Diagnosis not present

## 2022-01-27 LAB — HEMOGLOBIN A1C: Hgb A1c MFr Bld: 7.8 % — ABNORMAL HIGH (ref 4.6–6.5)

## 2022-01-29 ENCOUNTER — Other Ambulatory Visit: Payer: Self-pay | Admitting: Family

## 2022-01-29 DIAGNOSIS — E119 Type 2 diabetes mellitus without complications: Secondary | ICD-10-CM

## 2022-02-01 DIAGNOSIS — G4733 Obstructive sleep apnea (adult) (pediatric): Secondary | ICD-10-CM | POA: Diagnosis not present

## 2022-02-05 DIAGNOSIS — G4733 Obstructive sleep apnea (adult) (pediatric): Secondary | ICD-10-CM | POA: Diagnosis not present

## 2022-02-13 ENCOUNTER — Other Ambulatory Visit: Payer: Self-pay | Admitting: Family

## 2022-02-13 DIAGNOSIS — E119 Type 2 diabetes mellitus without complications: Secondary | ICD-10-CM

## 2022-02-13 MED ORDER — RYBELSUS 3 MG PO TABS: 3.0000 mg | ORAL_TABLET | Freq: Every day | ORAL | 1 refills | Status: DC

## 2022-02-14 ENCOUNTER — Other Ambulatory Visit: Payer: Self-pay | Admitting: Family

## 2022-02-14 DIAGNOSIS — E119 Type 2 diabetes mellitus without complications: Secondary | ICD-10-CM

## 2022-02-14 MED ORDER — OZEMPIC (0.25 OR 0.5 MG/DOSE) 2 MG/1.5ML ~~LOC~~ SOPN: 0.2500 mg | PEN_INJECTOR | SUBCUTANEOUS | 3 refills | Status: DC

## 2022-02-14 NOTE — Progress Notes (Signed)
close

## 2022-02-17 ENCOUNTER — Ambulatory Visit (INDEPENDENT_AMBULATORY_CARE_PROVIDER_SITE_OTHER): Payer: BC Managed Care – PPO | Admitting: Family

## 2022-02-17 ENCOUNTER — Encounter: Payer: Self-pay | Admitting: Family

## 2022-02-17 ENCOUNTER — Telehealth: Payer: Self-pay | Admitting: Family

## 2022-02-17 VITALS — BP 128/64 | HR 85 | Temp 98.3°F | Wt 299.2 lb

## 2022-02-17 DIAGNOSIS — R1011 Right upper quadrant pain: Secondary | ICD-10-CM

## 2022-02-17 LAB — COMPREHENSIVE METABOLIC PANEL
ALT: 62 U/L — ABNORMAL HIGH (ref 0–53)
AST: 45 U/L — ABNORMAL HIGH (ref 0–37)
Albumin: 4.8 g/dL (ref 3.5–5.2)
Alkaline Phosphatase: 50 U/L (ref 39–117)
BUN: 18 mg/dL (ref 6–23)
CO2: 29 mEq/L (ref 19–32)
Calcium: 9.7 mg/dL (ref 8.4–10.5)
Chloride: 98 mEq/L (ref 96–112)
Creatinine, Ser: 0.92 mg/dL (ref 0.40–1.50)
GFR: 96.39 mL/min (ref >60.00)
Glucose, Bld: 135 mg/dL — ABNORMAL HIGH (ref 70–99)
Potassium: 4.1 mEq/L (ref 3.5–5.1)
Sodium: 138 mEq/L (ref 135–145)
Total Bilirubin: 0.7 mg/dL (ref 0.2–1.2)
Total Protein: 7.3 g/dL (ref 6.0–8.3)

## 2022-02-17 LAB — CBC WITH DIFFERENTIAL/PLATELET
Basophils Absolute: 0 10*3/uL (ref 0.0–0.1)
Basophils Relative: 0.4 % (ref 0.0–3.0)
Eosinophils Absolute: 0.1 10*3/uL (ref 0.0–0.7)
Eosinophils Relative: 1.8 % (ref 0.0–5.0)
HCT: 45.5 % (ref 39.0–52.0)
Hemoglobin: 15.9 g/dL (ref 13.0–17.0)
Lymphocytes Relative: 27.3 % (ref 12.0–46.0)
Lymphs Abs: 2.2 10*3/uL (ref 0.7–4.0)
MCHC: 35 g/dL (ref 30.0–36.0)
MCV: 81.7 fl (ref 78.0–100.0)
Monocytes Absolute: 0.6 10*3/uL (ref 0.1–1.0)
Monocytes Relative: 7.2 % (ref 3.0–12.0)
Neutro Abs: 5 10*3/uL (ref 1.4–7.7)
Neutrophils Relative %: 63.3 % (ref 43.0–77.0)
Platelets: 270 10*3/uL (ref 150.0–400.0)
RBC: 5.57 Mil/uL (ref 4.22–5.81)
RDW: 14.1 % (ref 11.5–15.5)
WBC: 7.9 10*3/uL (ref 4.0–10.5)

## 2022-02-17 LAB — URINALYSIS, ROUTINE W REFLEX MICROSCOPIC
Bilirubin Urine: NEGATIVE
Hgb urine dipstick: NEGATIVE
Ketones, ur: NEGATIVE
Leukocytes,Ua: NEGATIVE
Nitrite: NEGATIVE
RBC / HPF: NONE SEEN (ref 0–?)
Specific Gravity, Urine: 1.02 (ref 1.000–1.030)
Total Protein, Urine: NEGATIVE
Urine Glucose: NEGATIVE
Urobilinogen, UA: 0.2 (ref 0.0–1.0)
pH: 5.5 (ref 5.0–8.0)

## 2022-02-17 LAB — LIPASE: Lipase: 41 U/L (ref 11.0–59.0)

## 2022-02-17 NOTE — Patient Instructions (Signed)
I ordered a full abdominal ultrasound this will evaluate for gallstones, aneurysm, and liver disease.  Certainly let me know if pain were to worsen in any way or you develop fever chills or new symptoms.  Let us know if you dont hear back within a week in regards to an appointment being scheduled.   So that you are aware, if you are [facility] MyChart user , please pay attention to your MyChart messages as you may receive a MyChart message with a phone number to call and schedule this test/appointment own your own from our referral coordinator. This is a new process so I do not want you to miss this message.  If you are not a MyChart user, you will receive a phone call.

## 2022-02-17 NOTE — Assessment & Plan Note (Signed)
Fortunately pain has improved.  Reassuring abdominal exam.  Pain is improved with Salonpas.  Seems to be more aggravated by sitting.  Interesting that it is relieved by walking.  We discussed perhaps soft tissue after URI symptoms last week.  Pending labs, abdominal ultrasound. Patient remain vigilant and let me know right away if symptoms were to change or worsen in any way

## 2022-02-17 NOTE — Progress Notes (Signed)
Subjective:    Patient ID: Todd Willis, male    DOB: 1971/02/20, 51 y.o.   MRN: [id number]  CC: Todd Willis is a 51 y.o. male who presents today for an acute visit.    HPI: Complains of ruq pain x 5 days, pain improved as of this today. Worse when sitting such as when working at computer. Walking improves the pain.   URI symptoms last week, negative cough.   He noticed abdominal pain x one week ago and noticed pain with 'violent coughing.'  When lays down to sleep, pain improves.   Some relief with tylenol, heat, salon pas pain patch  No fever, n, vomiting, dysuria, constipation.   No h/o renal stone.       He has not started ozempic.   Family history of suspected brain aneurysm; not confirmed.  No history of surgery  Never smoker history of GERD.  Compliant with Protonix 40 mg daily  HISTORY:  Past Medical History:  Diagnosis Date   Anxiety    on buspar and xanax in past   Diabetes (HCC)    Diverticulitis    GERD (gastroesophageal reflux disease)    uses zegrid occas   H/O cardiovascular stress test    normal per pt   High cholesterol    History of MRI of brain and brain stem    Normal per pt   Hypertension    Past Surgical History:  Procedure Laterality Date   NO PAST SURGERIES     Family History  Problem Relation Age of Onset   Hypertension Mother    Heart disease Mother 70       pacemaker   Hyperlipidemia Father    Heart disease Father    Stroke Father 7   Hypertension Father    Diabetes Father    Heart disease Maternal Grandmother    Hypertension Maternal Grandmother    Depression Maternal Grandmother    Aneurysm Maternal Grandmother        thought to be brain   Heart disease Maternal Grandfather    Hypertension Maternal Grandfather    Depression Maternal Grandfather    Aneurysm Maternal Grandfather        thought to be brain   Heart attack Paternal Grandfather 45    Allergies: Patient has no known allergies. Current Outpatient  Medications on File Prior to Visit  Medication Sig Dispense Refill   aspirin EC 81 MG tablet Take 1 tablet (81 mg total) by mouth daily. 30 tablet 11   carvedilol (COREG) 3.125 MG tablet TAKE 1 TABLET(3.125 MG) BY MOUTH TWICE DAILY WITH A MEAL 60 tablet 3   hydrochlorothiazide (HYDRODIURIL) 25 MG tablet Take 1 tablet (25 mg total) by mouth daily. 30 tablet 5   losartan (COZAAR) 100 MG tablet TAKE 1 TABLET(100 MG) BY MOUTH DAILY 90 tablet 3   metFORMIN (GLUCOPHAGE-XR) 500 MG 24 hr tablet TAKE 2 TABLETS(1000 MG) BY MOUTH TWICE DAILY 120 tablet 2   Multiple Vitamin (MULTIVITAMIN) tablet Take 1 tablet by mouth daily.     pantoprazole (PROTONIX) 40 MG tablet TAKE 1 TABLET(40 MG) BY MOUTH DAILY ON AN EMPTY STOMACH 90 tablet 1   rosuvastatin (CRESTOR) 5 MG tablet TAKE 1 TABLET(5 MG) BY MOUTH DAILY 90 tablet 3   Semaglutide,0.25 or 0.5MG /DOS, (OZEMPIC, 0.25 OR 0.5 MG/DOSE,) 2 MG/1.5ML SOPN Inject 0.25 mg into the skin once a week. After 4 weeks, increase to 0.5mg  [place] qwk. 3 mL 3   No current facility-administered medications on file  prior to visit.    Social History   Tobacco Use   Smoking status: Never   Smokeless tobacco: Never  Substance Use Topics   Alcohol use: No   Drug use: No    Review of Systems  Constitutional:  Negative for chills and fever.  Respiratory:  Negative for cough.   Cardiovascular:  Negative for chest pain and palpitations.  Gastrointestinal:  Positive for abdominal pain. Negative for blood in stool, constipation, diarrhea, nausea and vomiting.  Genitourinary:  Negative for difficulty urinating and dysuria.      Objective:    BP 128/64   Pulse 85   Temp 98.3 F (36.8 C) (Oral)   Wt 299 lb 3.2 oz (135.7 kg)   SpO2 97%   BMI 37.40 kg/m  BP Readings from Last 3 Encounters:  02/17/22 128/64  12/27/21 126/80  08/12/21 122/78     Physical Exam Vitals reviewed.  Constitutional:      Appearance: Normal appearance. He is well-developed.  Cardiovascular:      Rate and Rhythm: Regular rhythm.     Heart sounds: Normal heart sounds.  Pulmonary:     Effort: Pulmonary effort is normal. No respiratory distress.     Breath sounds: Normal breath sounds. No wheezing or rales.  Abdominal:     General: Bowel sounds are normal. There is no distension.     Palpations: Abdomen is soft. Abdomen is not rigid. There is no fluid wave or mass.     Tenderness: There is no abdominal tenderness. There is no right CVA tenderness, left CVA tenderness, guarding or rebound. Negative signs include Murphy's sign and McBurney's sign.  Skin:    General: Skin is warm and dry.  Neurological:     Mental Status: He is alert.  Psychiatric:        Speech: Speech normal.        Behavior: Behavior normal.        Assessment & Plan:   Problem List Items Addressed This Visit       Other   RUQ pain - Primary    Fortunately pain has improved.  Reassuring abdominal exam.  Pain is improved with Salonpas.  Seems to be more aggravated by sitting.  Interesting that it is relieved by walking.  We discussed perhaps soft tissue after URI symptoms last week.  Pending labs, abdominal ultrasound. Patient remain vigilant and let me know right away if symptoms were to change or worsen in any way      Relevant Orders   US Abdomen Complete   Comprehensive metabolic panel   CBC with Differential/Platelet   Lipase   Urinalysis, Routine w reflex microscopic   Urine Culture      I am having Todd Willis "Todd Willis" maintain his multivitamin, aspirin EC, losartan, pantoprazole, metFORMIN, hydrochlorothiazide, carvedilol, rosuvastatin, and Ozempic (0.25 or 0.5 MG/DOSE).   No orders of the defined types were placed in this encounter.   Return precautions given.   Risks, benefits, and alternatives of the medications and treatment plan prescribed today were discussed, and patient expressed understanding.   Education regarding symptom management and diagnosis given to patient on  AVS.  Continue to follow with Burnard Hawthorne, FNP for routine health maintenance.   Todd Willis and I agreed with plan.   Mable Paris, FNP

## 2022-02-17 NOTE — Telephone Encounter (Signed)
Patient returned call to schedule ultrasound.  I transferred call to [facility].

## 2022-02-17 NOTE — Telephone Encounter (Signed)
Lft pt vm to call ofc to sch US abdomen. thanks 

## 2022-02-18 LAB — URINE CULTURE
MICRO NUMBER:: 14258127
Result:: NO GROWTH
SPECIMEN QUALITY:: ADEQUATE

## 2022-02-21 ENCOUNTER — Other Ambulatory Visit: Payer: Self-pay | Admitting: Family

## 2022-02-21 DIAGNOSIS — R899 Unspecified abnormal finding in specimens from other organs, systems and tissues: Secondary | ICD-10-CM

## 2022-02-23 ENCOUNTER — Ambulatory Visit
Admission: RE | Admit: 2022-02-23 | Discharge: 2022-02-23 | Disposition: A | Discharge status: Home / Self Care | Payer: BC Managed Care – PPO | Source: Ambulatory Visit | Attending: Family | Admitting: Family

## 2022-02-23 DIAGNOSIS — R1011 Right upper quadrant pain: Secondary | ICD-10-CM | POA: Diagnosis not present

## 2022-02-23 DIAGNOSIS — K760 Fatty (change of) liver, not elsewhere classified: Secondary | ICD-10-CM | POA: Diagnosis not present

## 2022-02-28 ENCOUNTER — Encounter: Payer: Self-pay | Admitting: Family

## 2022-03-06 DIAGNOSIS — G4733 Obstructive sleep apnea (adult) (pediatric): Secondary | ICD-10-CM | POA: Diagnosis not present

## 2022-03-07 DIAGNOSIS — G4733 Obstructive sleep apnea (adult) (pediatric): Secondary | ICD-10-CM | POA: Diagnosis not present

## 2022-03-08 ENCOUNTER — Other Ambulatory Visit: Payer: Self-pay | Admitting: Family

## 2022-03-08 DIAGNOSIS — Z8639 Personal history of other endocrine, nutritional and metabolic disease: Secondary | ICD-10-CM

## 2022-03-08 DIAGNOSIS — Z20828 Contact with and (suspected) exposure to other viral communicable diseases: Secondary | ICD-10-CM

## 2022-03-08 MED ORDER — OSELTAMIVIR PHOSPHATE 75 MG PO CAPS: 75.0000 mg | ORAL_CAPSULE | Freq: Two times a day (BID) | ORAL | 0 refills | Status: DC

## 2022-03-09 DIAGNOSIS — H43813 Vitreous degeneration, bilateral: Secondary | ICD-10-CM | POA: Diagnosis not present

## 2022-03-23 ENCOUNTER — Other Ambulatory Visit (INDEPENDENT_AMBULATORY_CARE_PROVIDER_SITE_OTHER): Payer: BC Managed Care – PPO

## 2022-03-23 DIAGNOSIS — Z8639 Personal history of other endocrine, nutritional and metabolic disease: Secondary | ICD-10-CM

## 2022-03-23 DIAGNOSIS — R899 Unspecified abnormal finding in specimens from other organs, systems and tissues: Secondary | ICD-10-CM

## 2022-03-23 LAB — VITAMIN D 25 HYDROXY (VIT D DEFICIENCY, FRACTURES): VITD: 24.7 ng/mL — ABNORMAL LOW (ref 30.00–100.00)

## 2022-03-23 LAB — HEPATIC FUNCTION PANEL
ALT: 67 U/L — ABNORMAL HIGH (ref 0–53)
AST: 50 U/L — ABNORMAL HIGH (ref 0–37)
Albumin: 4.4 g/dL (ref 3.5–5.2)
Alkaline Phosphatase: 51 U/L (ref 39–117)
Bilirubin, Direct: 0.1 mg/dL (ref 0.0–0.3)
Total Bilirubin: 0.6 mg/dL (ref 0.2–1.2)
Total Protein: 6.7 g/dL (ref 6.0–8.3)

## 2022-03-27 ENCOUNTER — Other Ambulatory Visit: Payer: Self-pay | Admitting: Family

## 2022-03-27 DIAGNOSIS — Z8639 Personal history of other endocrine, nutritional and metabolic disease: Secondary | ICD-10-CM

## 2022-03-27 MED ORDER — CHOLECALCIFEROL 1.25 MG (50000 UT) PO TABS: ORAL_TABLET | ORAL | 0 refills | Status: AC

## 2022-04-06 DIAGNOSIS — G4733 Obstructive sleep apnea (adult) (pediatric): Secondary | ICD-10-CM | POA: Diagnosis not present

## 2022-04-07 ENCOUNTER — Telehealth (INDEPENDENT_AMBULATORY_CARE_PROVIDER_SITE_OTHER): Payer: BC Managed Care – PPO | Admitting: Family

## 2022-04-07 ENCOUNTER — Encounter: Payer: Self-pay | Admitting: Family

## 2022-04-07 VITALS — BP 125/86 | HR 78 | Ht 75.0 in | Wt 294.0 lb

## 2022-04-07 DIAGNOSIS — E119 Type 2 diabetes mellitus without complications: Secondary | ICD-10-CM

## 2022-04-07 MED ORDER — BLOOD GLUCOSE METER KIT: PACK | 0 refills | Status: AC

## 2022-04-07 MED ORDER — ACCU-CHEK SOFTCLIX LANCETS MISC: 12 refills | Status: DC

## 2022-04-07 NOTE — Patient Instructions (Signed)
Let me know how you are doing on ozempic 0.25mg 

## 2022-04-07 NOTE — Assessment & Plan Note (Addendum)
Nausea and vomiting resolved. Discussed if gastroenteritis versus side effect of Ozempic. We discussed GERD.  Counseled on the importance of small frequent meals to make an action of Ozempic He is pleased with weight loss thus far and would like to continue ozempic 0.25mg  for now to see if symptom improves.

## 2022-04-07 NOTE — Addendum Note (Signed)
Addended by: [place], Thedora Rings on: 04/07/2022 10:22 AM   Modules accepted: Orders

## 2022-04-07 NOTE — Progress Notes (Signed)
Virtual Visit via Video Note  I connected with Todd Willis on 04/07/22 at  9:30 AM EST by a video enabled telemedicine application and verified that I am speaking with the correct person using two identifiers. Location patient: home Location provider: work  Persons participating in the virtual visit: patient, provider  I discussed the limitations of evaluation and management by telemedicine and the availability of in person appointments. The patient expressed understanding and agreed to proceed.  HPI: Follow up ozempic  He has had two doses of ozempic 0.25mg  3 days ago, he had started to have indigestion and an episode of nonbloody vomiting. He had another episode of vomiting early the following morning.   Had sausage and egg bisc[place] that morning. Tangerine, banana for snack and plate of ravoli at 7pm for dinner. He didn't lay down until 9pm.  The following day had chicken broth and over the course of the couple of days, he has gradually started to eat normally again.   Denies fever, chills  No one else in the house hold has had GI symptoms.    Compliant with protonix 40mg  qam.    ROS: See pertinent positives and negatives per HPI.  EXAM:  VITALS per patient if applicable: BP 413/24   Pulse 78   Ht 6\' 3"  (1.905 m)   Wt 294 lb (133.4 kg)   SpO2 99%   BMI 36.75 kg/m  BP Readings from Last 3 Encounters:  04/07/22 125/86  02/17/22 128/64  12/27/21 126/80   Wt Readings from Last 3 Encounters:  04/07/22 294 lb (133.4 kg)  02/17/22 299 lb 3.2 oz (135.7 kg)  12/27/21 (!) 305 lb 3.2 oz (138.4 kg)    GENERAL: alert, oriented, appears well and in no acute distress  HEENT: atraumatic, conjunttiva clear, no obvious abnormalities on inspection of external nose and ears  NECK: normal movements of the head and neck  LUNGS: on inspection no signs of respiratory distress, breathing rate appears normal, no obvious gross SOB, gasping or wheezing  CV: no obvious  cyanosis  MS: moves all visible extremities without noticeable abnormality  PSYCH/NEURO: pleasant and cooperative, no obvious depression or anxiety, speech and thought processing grossly intact  ASSESSMENT AND PLAN: Diabetes mellitus without complication ([place]) Assessment & Plan: Nausea and vomiting resolved. Discussed if gastroenteritis versus side effect of Ozempic. We discussed GERD.  Counseled on the importance of small frequent meals to make an action of Ozempic He is pleased with weight loss thus far and would like to continue ozempic 0.25mg  for now to see if symptom improves.       -we discussed possible serious and likely etiologies, options for evaluation and workup, limitations of telemedicine visit vs in person visit, treatment, treatment risks and precautions. Pt prefers to treat via telemedicine empirically rather then risking or undertaking an in person visit at this moment.    I discussed the assessment and treatment plan with the patient. The patient was provided an opportunity to ask questions and all were answered. The patient agreed with the plan and demonstrated an understanding of the instructions.   The patient was advised to call back or seek an in-person evaluation if the symptoms worsen or if the condition fails to improve as anticipated.  Advised if desired AVS can be mailed or viewed via [place] if [place] user.   Mable Paris, FNP

## 2022-04-16 ENCOUNTER — Other Ambulatory Visit: Payer: Self-pay | Admitting: Family

## 2022-04-16 DIAGNOSIS — E119 Type 2 diabetes mellitus without complications: Secondary | ICD-10-CM

## 2022-04-21 ENCOUNTER — Other Ambulatory Visit: Payer: Self-pay | Admitting: Family

## 2022-04-21 DIAGNOSIS — K219 Gastro-esophageal reflux disease without esophagitis: Secondary | ICD-10-CM

## 2022-04-24 ENCOUNTER — Encounter: Payer: Self-pay | Admitting: Family

## 2022-04-27 ENCOUNTER — Telehealth: Payer: Self-pay | Admitting: Family

## 2022-04-27 DIAGNOSIS — K219 Gastro-esophageal reflux disease without esophagitis: Secondary | ICD-10-CM

## 2022-04-27 MED ORDER — PANTOPRAZOLE SODIUM 40 MG PO TBEC: DELAYED_RELEASE_TABLET | ORAL | 2 refills | Status: DC

## 2022-04-27 NOTE — Telephone Encounter (Signed)
Rx  sent in to pharmacy and pt was notified!

## 2022-04-27 NOTE — Telephone Encounter (Signed)
Prescription Request  04/27/2022  Is this a "Controlled Substance" medicine? No  LOV: 02/17/2022  What is the name of the medication or equipment? pantoprazole (PROTONIX) 40 MG tablet  Have you contacted your pharmacy to request a refill? Yes   Which pharmacy would you like this sent to?   [facility] [place], [address] AT [address] [place] [address] Phone: [phone] Fax: [phone]    Patient notified that their request is being sent to the clinical staff for review and that they should receive a response within 2 business days.   Please advise at Mobile [phone] (mobile)

## 2022-05-07 DIAGNOSIS — G4733 Obstructive sleep apnea (adult) (pediatric): Secondary | ICD-10-CM | POA: Diagnosis not present

## 2022-05-12 ENCOUNTER — Ambulatory Visit (INDEPENDENT_AMBULATORY_CARE_PROVIDER_SITE_OTHER): Payer: BC Managed Care – PPO | Admitting: Family

## 2022-05-12 ENCOUNTER — Encounter: Payer: Self-pay | Admitting: Family

## 2022-05-12 VITALS — BP 128/82 | HR 100 | Temp 98.5°F | Ht 75.0 in | Wt 284.8 lb

## 2022-05-12 DIAGNOSIS — I10 Essential (primary) hypertension: Secondary | ICD-10-CM

## 2022-05-12 DIAGNOSIS — E119 Type 2 diabetes mellitus without complications: Secondary | ICD-10-CM

## 2022-05-12 DIAGNOSIS — R7309 Other abnormal glucose: Secondary | ICD-10-CM

## 2022-05-12 LAB — POCT GLYCOSYLATED HEMOGLOBIN (HGB A1C): Hemoglobin A1C: 6.7 % — AB (ref 4.0–5.6)

## 2022-05-12 NOTE — Progress Notes (Signed)
Assessment & Plan:  Elevated glucose -     POCT glycosylated hemoglobin (Hb A1C)  Essential hypertension Assessment & Plan: Chronic, stable. Continue HCTZ 25 mg daily,  losartan 100 mg daily, Coreg 3.125 mg BID.   Diabetes mellitus without complication [facility]) Assessment & Plan: Lab Results  Component Value Date   HGBA1C 6.7 (A) 05/12/2022   Nausea improved.  He has done quite well on Ozempic 0.5 mg; he has lost approximately lost 15 lbs since starting medication, total of almost 30 pounds prior too during last year.  We agreed to continue Ozempic 0.5 mg, metformin 1000 mg twice daily.  Samples provided due to cost of Ozempic.      Return precautions given.   Risks, benefits, and alternatives of the medications and treatment plan prescribed today were discussed, and patient expressed understanding.   Education regarding symptom management and diagnosis given to patient on AVS either electronically or printed.  Return in about 3 months (around 08/10/2022).  Mable Paris, FNP  Subjective:    Patient ID: Torben Krawiec, male    DOB: 10-16-1970, 52 y.o.   MRN: [id number]  CC: Malon Monin is a 52 y.o. male who presents today for follow up.   HPI: Feels well today No new complaints  He has completed ozempic 0.'5mg'$  x 3 weeks.  He has lost 30 lbs since last year.   Medication costs $900 through insurance.  He had nausea the first two weeks, since improved.  He remains compliant with metformin.     Allergies: Patient has no known allergies. Current Outpatient Medications on File Prior to Visit  Medication Sig Dispense Refill   Accu-Chek Softclix Lancets lancets Use as instructed 100 each 12   aspirin EC 81 MG tablet Take 1 tablet (81 mg total) by mouth daily. 30 tablet 11   blood glucose meter kit and supplies Dispense based on patient and insurance preference. Use up to four times daily as directed. (FOR ICD-10 E10.9, E11.9). 1 each 0   carvedilol (COREG) 3.125 MG  tablet TAKE 1 TABLET(3.125 MG) BY MOUTH TWICE DAILY WITH A MEAL 60 tablet 3   Cholecalciferol 1.25 MG (50000 UT) TABS 50,000 units PO qwk for 8 weeks. 8 tablet 0   hydrochlorothiazide (HYDRODIURIL) 25 MG tablet Take 1 tablet (25 mg total) by mouth daily. 30 tablet 5   losartan (COZAAR) 100 MG tablet TAKE 1 TABLET(100 MG) BY MOUTH DAILY 90 tablet 3   metFORMIN (GLUCOPHAGE-XR) 500 MG 24 hr tablet TAKE 2 TABLETS(1000 MG) BY MOUTH TWICE DAILY 120 tablet 2   Multiple Vitamin (MULTIVITAMIN) tablet Take 1 tablet by mouth daily.     pantoprazole (PROTONIX) 40 MG tablet Take one 40 mg tablet by mouth daily on an empty stomach 90 tablet 2   rosuvastatin (CRESTOR) 5 MG tablet TAKE 1 TABLET(5 MG) BY MOUTH DAILY 90 tablet 3   Semaglutide,0.25 or 0.'5MG'$ /DOS, (OZEMPIC, 0.25 OR 0.5 MG/DOSE,) 2 MG/1.5ML SOPN Inject 0.25 mg into the skin once a week. After 4 weeks, increase to 0.'5mg'$  [place] qwk. 3 mL 3   No current facility-administered medications on file prior to visit.    Review of Systems  Constitutional:  Negative for chills and fever.  Respiratory:  Negative for cough.   Cardiovascular:  Negative for chest pain and palpitations.  Gastrointestinal:  Negative for nausea and vomiting.      Objective:    BP 128/82   Pulse 100   Temp 98.5 F (36.9 C) (Oral)   Ht  $'6\' 3"'f$  (1.905 m)   Wt 284 lb 12.8 oz (129.2 kg)   SpO2 98%   BMI 35.60 kg/m  BP Readings from Last 3 Encounters:  05/12/22 128/82  04/07/22 125/86  02/17/22 128/64   Wt Readings from Last 3 Encounters:  05/12/22 284 lb 12.8 oz (129.2 kg)  04/07/22 294 lb (133.4 kg)  02/17/22 299 lb 3.2 oz (135.7 kg)    Physical Exam Vitals reviewed.  Constitutional:      Appearance: He is well-developed.  Cardiovascular:     Rate and Rhythm: Regular rhythm.     Heart sounds: Normal heart sounds.  Pulmonary:     Effort: Pulmonary effort is normal. No respiratory distress.     Breath sounds: Normal breath sounds. No wheezing, rhonchi or rales.   Skin:    General: Skin is warm and dry.  Neurological:     Mental Status: He is alert.  Psychiatric:        Speech: Speech normal.        Behavior: Behavior normal.

## 2022-05-12 NOTE — Patient Instructions (Signed)
Continue ozempic 0.'5mg'$  .   Let me know if nausea returns.

## 2022-05-12 NOTE — Assessment & Plan Note (Signed)
Chronic, stable. Continue HCTZ 25 mg daily,  losartan 100 mg daily, Coreg 3.125 mg BID.

## 2022-05-12 NOTE — Assessment & Plan Note (Addendum)
Lab Results  Component Value Date   HGBA1C 6.7 (A) 05/12/2022   Nausea improved.  He has done quite well on Ozempic 0.5 mg; he has lost approximately lost 15 lbs since starting medication, total of almost 30 pounds prior too during last year.  We agreed to continue Ozempic 0.5 mg, metformin 1000 mg twice daily.  Samples provided due to cost of Ozempic.

## 2022-05-20 ENCOUNTER — Other Ambulatory Visit: Payer: Self-pay | Admitting: Family

## 2022-05-20 DIAGNOSIS — I10 Essential (primary) hypertension: Secondary | ICD-10-CM

## 2022-05-23 DIAGNOSIS — M7541 Impingement syndrome of right shoulder: Secondary | ICD-10-CM | POA: Diagnosis not present

## 2022-06-13 DIAGNOSIS — M25511 Pain in right shoulder: Secondary | ICD-10-CM | POA: Diagnosis not present

## 2022-07-13 ENCOUNTER — Ambulatory Visit (INDEPENDENT_AMBULATORY_CARE_PROVIDER_SITE_OTHER): Payer: BC Managed Care – PPO | Admitting: Family

## 2022-07-13 ENCOUNTER — Encounter: Payer: Self-pay | Admitting: Family

## 2022-07-13 VITALS — BP 126/80 | HR 89 | Temp 98.7°F | Ht 75.0 in | Wt 274.2 lb

## 2022-07-13 DIAGNOSIS — Z125 Encounter for screening for malignant neoplasm of prostate: Secondary | ICD-10-CM

## 2022-07-13 DIAGNOSIS — E119 Type 2 diabetes mellitus without complications: Secondary | ICD-10-CM | POA: Diagnosis not present

## 2022-07-13 DIAGNOSIS — I10 Essential (primary) hypertension: Secondary | ICD-10-CM

## 2022-07-13 LAB — CBC WITH DIFFERENTIAL/PLATELET
Basophils Absolute: 0 10*3/uL (ref 0.0–0.1)
Basophils Relative: 0.4 % (ref 0.0–3.0)
Eosinophils Absolute: 0.1 10*3/uL (ref 0.0–0.7)
Eosinophils Relative: 1.8 % (ref 0.0–5.0)
HCT: 44.5 % (ref 39.0–52.0)
Hemoglobin: 15.2 g/dL (ref 13.0–17.0)
Lymphocytes Relative: 22.3 % (ref 12.0–46.0)
Lymphs Abs: 1.6 10*3/uL (ref 0.7–4.0)
MCHC: 34.2 g/dL (ref 30.0–36.0)
MCV: 85.4 fl (ref 78.0–100.0)
Monocytes Absolute: 0.5 10*3/uL (ref 0.1–1.0)
Monocytes Relative: 6.5 % (ref 3.0–12.0)
Neutro Abs: 5.1 10*3/uL (ref 1.4–7.7)
Neutrophils Relative %: 69 % (ref 43.0–77.0)
Platelets: 217 10*3/uL (ref 150.0–400.0)
RBC: 5.21 Mil/uL (ref 4.22–5.81)
RDW: 14.1 % (ref 11.5–15.5)
WBC: 7.3 10*3/uL (ref 4.0–10.5)

## 2022-07-13 LAB — LIPID PANEL
Cholesterol: 117 mg/dL (ref 0–200)
HDL: 37 mg/dL — ABNORMAL LOW (ref >39.00)
LDL Cholesterol: 55 mg/dL (ref 0–99)
NonHDL: 80.23
Total CHOL/HDL Ratio: 3
Triglycerides: 124 mg/dL (ref 0.0–149.0)
VLDL: 24.8 mg/dL (ref 0.0–40.0)

## 2022-07-13 LAB — COMPREHENSIVE METABOLIC PANEL
ALT: 30 U/L (ref 0–53)
AST: 24 U/L (ref 0–37)
Albumin: 4.5 g/dL (ref 3.5–5.2)
Alkaline Phosphatase: 46 U/L (ref 39–117)
BUN: 20 mg/dL (ref 6–23)
CO2: 29 mEq/L (ref 19–32)
Calcium: 9.7 mg/dL (ref 8.4–10.5)
Chloride: 101 mEq/L (ref 96–112)
Creatinine, Ser: 0.99 mg/dL (ref 0.40–1.50)
GFR: 88.02 mL/min (ref >60.00)
Glucose, Bld: 124 mg/dL — ABNORMAL HIGH (ref 70–99)
Potassium: 4.2 mEq/L (ref 3.5–5.1)
Sodium: 139 mEq/L (ref 135–145)
Total Bilirubin: 0.8 mg/dL (ref 0.2–1.2)
Total Protein: 7 g/dL (ref 6.0–8.3)

## 2022-07-13 LAB — HEMOGLOBIN A1C: Hgb A1c MFr Bld: 6.1 % (ref 4.6–6.5)

## 2022-07-13 LAB — MICROALBUMIN / CREATININE URINE RATIO
Creatinine,U: 114.4 mg/dL
Microalb Creat Ratio: 0.9 mg/g (ref 0.0–30.0)
Microalb, Ur: 1 mg/dL (ref 0.0–1.9)

## 2022-07-13 LAB — PSA: PSA: 0.43 ng/mL (ref 0.10–4.00)

## 2022-07-13 LAB — TSH: TSH: 2.04 u[IU]/mL (ref 0.35–5.50)

## 2022-07-13 MED ORDER — ACCU-CHEK SOFTCLIX LANCETS MISC: 12 refills | Status: AC

## 2022-07-13 MED ORDER — METFORMIN HCL ER 500 MG PO TB24: 1000.0000 mg | ORAL_TABLET | Freq: Two times a day (BID) | ORAL | 3 refills | Status: DC

## 2022-07-13 MED ORDER — HYDROCHLOROTHIAZIDE 25 MG PO TABS: 25.0000 mg | ORAL_TABLET | Freq: Every day | ORAL | 3 refills | Status: DC

## 2022-07-13 NOTE — Progress Notes (Signed)
Assessment & Plan:  Essential hypertension Assessment & Plan: Chronic, stable. Continue HCTZ 25 mg daily,  losartan 100 mg daily, Coreg 3.125 mg BID.  Orders: -     hydroCHLOROthiazide; Take 1 tablet (25 mg total) by mouth daily.  Dispense: 90 tablet; Refill: 3 -     TSH -     CBC with Differential/Platelet -     Comprehensive metabolic panel  Diabetes mellitus without complication Assessment & Plan: Lab Results  Component Value Date   HGBA1C 6.7 (A) 05/12/2022   Patient considering increasing Ozempic to 1 mg however would like to see A1c ( pending).  If A1c is unchanged, he would be more inclined to increase to achieve his goal of weight loss.  Continue Ozempic 0.5 mg, metformin 1000 mg twice daily.    Orders: -     metFORMIN HCl ER; Take 2 tablets (1,000 mg total) by mouth 2 (two) times daily.  Dispense: 360 tablet; Refill: 3 -     Accu-Chek Softclix Lancets; Use as instructed  Dispense: 100 each; Refill: 12 -     Hemoglobin A1c -     Lipid panel -     Microalbumin / creatinine urine ratio  Screening for prostate cancer -     PSA     Return precautions given.   Risks, benefits, and alternatives of the medications and treatment plan prescribed today were discussed, and patient expressed understanding.   Education regarding symptom management and diagnosis given to patient on AVS either electronically or printed.  Return in about 3 months (around 10/12/2022).  Rennie Plowman, FNP  Subjective:    Patient ID: Todd Willis, male    DOB: October 06, 1970, 52 y.o.   MRN: [id number]  CC: Todd Willis is a 52 y.o. male who presents today for follow up.   HPI: Feels well today No new complaints  He is interested in weight loss. Goal 250lbs  He has lost weight in his waistline.     Compliant with Ozempic 0.5 mg, metformin  bid. He feels weight loss has plateaued. Appetite is not longer suppressed.    Allergies: Patient has no known allergies. Current  Outpatient Medications on File Prior to Visit  Medication Sig Dispense Refill   aspirin EC 81 MG tablet Take 1 tablet (81 mg total) by mouth daily. 30 tablet 11   blood glucose meter kit and supplies Dispense based on patient and insurance preference. Use up to four times daily as directed. (FOR ICD-10 E10.9, E11.9). 1 each 0   carvedilol (COREG) 3.125 MG tablet TAKE 1 TABLET(3.125 MG) BY MOUTH TWICE DAILY WITH A MEAL 60 tablet 3   Cholecalciferol 1.25 MG (50000 UT) TABS 50,000 units PO qwk for 8 weeks. 8 tablet 0   losartan (COZAAR) 100 MG tablet TAKE 1 TABLET(100 MG) BY MOUTH DAILY 90 tablet 3   Multiple Vitamin (MULTIVITAMIN) tablet Take 1 tablet by mouth daily.     pantoprazole (PROTONIX) 40 MG tablet Take one 40 mg tablet by mouth daily on an empty stomach 90 tablet 2   rosuvastatin (CRESTOR) 5 MG tablet TAKE 1 TABLET(5 MG) BY MOUTH DAILY 90 tablet 3   Semaglutide,0.25 or 0.5MG /DOS, (OZEMPIC, 0.25 OR 0.5 MG/DOSE,) 2 MG/1.5ML SOPN Inject 0.25 mg into the skin once a week. After 4 weeks, increase to 0.5mg  [place] qwk. 3 mL 3   No current facility-administered medications on file prior to visit.    Review of Systems  Constitutional:  Negative for chills and fever.  Respiratory:  Negative for cough.   Cardiovascular:  Negative for chest pain and palpitations.  Gastrointestinal:  Negative for nausea and vomiting.      Objective:    BP 126/80   Pulse 89   Temp 98.7 F (37.1 C) (Oral)   Ht  (1.905 m)   Wt 274 lb 3.2 oz (124.4 kg)   SpO2 98%   BMI 34.27 kg/m  BP Readings from Last 3 Encounters:  07/13/22 126/80  05/12/22 128/82  04/07/22 125/86   Wt Readings from Last 3 Encounters:  07/13/22 274 lb 3.2 oz (124.4 kg)  05/12/22 284 lb 12.8 oz (129.2 kg)  04/07/22 294 lb (133.4 kg)    Physical Exam Vitals reviewed.  Constitutional:      Appearance: He is well-developed.  Cardiovascular:     Rate and Rhythm: Regular rhythm.     Heart sounds: Normal heart sounds.   Pulmonary:     Effort: Pulmonary effort is normal. No respiratory distress.     Breath sounds: Normal breath sounds. No wheezing, rhonchi or rales.  Skin:    General: Skin is warm and dry.  Neurological:     Mental Status: He is alert.  Psychiatric:        Speech: Speech normal.        Behavior: Behavior normal.

## 2022-07-13 NOTE — Assessment & Plan Note (Signed)
Chronic, stable. Continue HCTZ 25 mg daily,  losartan 100 mg daily, Coreg 3.125 mg BID. 

## 2022-07-13 NOTE — Assessment & Plan Note (Signed)
Lab Results  Component Value Date   HGBA1C 6.7 (A) 05/12/2022   Patient considering increasing Ozempic to 1 mg however would like to see A1c ( pending).  If A1c is unchanged, he would be more inclined to increase to achieve his goal of weight loss.  Continue Ozempic 0.5 mg, metformin 1000 mg twice daily.

## 2022-07-14 ENCOUNTER — Encounter: Payer: Self-pay | Admitting: Family

## 2022-07-19 ENCOUNTER — Other Ambulatory Visit: Payer: Self-pay

## 2022-07-19 MED ORDER — SEMAGLUTIDE (1 MG/DOSE) 4 MG/3ML ~~LOC~~ SOPN: 1.0000 mg | PEN_INJECTOR | SUBCUTANEOUS | 3 refills | Status: DC

## 2022-07-19 NOTE — Telephone Encounter (Signed)
DONE

## 2022-07-19 NOTE — Telephone Encounter (Signed)
Patient called about MyChart message. He wanted to know if [facility] sent in the prescription for Ozempic 1mg  weekly. Please call  him.

## 2022-07-23 ENCOUNTER — Other Ambulatory Visit: Payer: Self-pay | Admitting: Family

## 2022-07-23 DIAGNOSIS — I10 Essential (primary) hypertension: Secondary | ICD-10-CM

## 2022-08-04 ENCOUNTER — Ambulatory Visit: Payer: BC Managed Care – PPO | Admitting: Family

## 2022-08-11 ENCOUNTER — Ambulatory Visit: Payer: BC Managed Care – PPO | Admitting: Family

## 2022-10-04 ENCOUNTER — Telehealth: Payer: BC Managed Care – PPO | Admitting: Physician Assistant

## 2022-10-04 DIAGNOSIS — T63441A Toxic effect of venom of bees, accidental (unintentional), initial encounter: Secondary | ICD-10-CM

## 2022-10-04 DIAGNOSIS — L0390 Cellulitis, unspecified: Secondary | ICD-10-CM | POA: Diagnosis not present

## 2022-10-04 MED ORDER — CEPHALEXIN 500 MG PO CAPS
500.0000 mg | ORAL_CAPSULE | Freq: Three times a day (TID) | ORAL | 0 refills | Status: AC
Start: 2022-10-04 — End: 2022-10-14

## 2022-10-04 NOTE — Progress Notes (Signed)
Virtual Visit Consent   Todd Willis, you are scheduled for a virtual visit with a [facility] provider today. Just as with appointments in the office, your consent must be obtained to participate. Your consent will be active for this visit and any virtual visit you may have with one of our providers in the next 365 days. If you have a MyChart account, a copy of this consent can be sent to you electronically.  As this is a virtual visit, video technology does not allow for your provider to perform a traditional examination. This may limit your provider's ability to fully assess your condition. If your provider identifies any concerns that need to be evaluated in person or the need to arrange testing (such as labs, EKG, etc.), we will make arrangements to do so. Although advances in technology are sophisticated, we cannot ensure that it will always work on either your end or our end. If the connection with a video visit is poor, the visit may have to be switched to a telephone visit. With either a video or telephone visit, we are not always able to ensure that we have a secure connection.  By engaging in this virtual visit, you consent to the provision of healthcare and authorize for your insurance to be billed (if applicable) for the services provided during this visit. Depending on your insurance coverage, you may receive a charge related to this service.  I need to obtain your verbal consent now. Are you willing to proceed with your visit today? Todd Willis has provided verbal consent on 10/04/2022 for a virtual visit (video or telephone). Todd Loveless, PA-C  Date: 10/04/2022 8:57 AM  Virtual Visit via Video Note   I, Todd Willis, connected with  Todd Willis  ([id number], 1970-11-26) on 10/04/22 at  8:45 AM EDT by a video-enabled telemedicine application and verified that I am speaking with the correct person using two identifiers.  Location: Patient: Virtual Visit Location  Patient: Mobile Provider: Virtual Visit Location Provider: Home Office   I discussed the limitations of evaluation and management by telemedicine and the availability of in person appointments. The patient expressed understanding and agreed to proceed.    History of Present Illness: Todd Willis is a 52 y.o. who identifies as a male who was assigned male at birth, and is being seen today for bee sting.  HPI: HPI  Stung Monday, 10/02/22, on the right ankle. Was doing better, but last night ankle became more swollen, red, pruritic, and hot to touch. He has had cellulitis following stings and bites two other times in the past. He denies any fevers, chills, nausea, vomiting, facial swelling, swelling of the mouth/tongue/throat, difficulty breathing, or shortness of breath.   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   Diabetes mellitus without complication (HCC) 04/26/2018   Fatigue 10/22/2017   Essential hypertension 09/01/2011   Hyperlipidemia 09/01/2011   Screening for prostate cancer 09/01/2011   Anxiety 09/01/2011   GERD (gastroesophageal reflux disease) 09/01/2011   Obesity (BMI 30-39.9) 09/01/2011    Allergies: No Known Allergies Medications:  Current Outpatient Medications:    cephALEXin (KEFLEX) 500 MG capsule, Take 1 capsule (500 mg total) by mouth 3 (three) times daily for 10 days., Disp: 30 capsule,  Rfl: 0   Semaglutide, 1 MG/DOSE, 4 MG/3ML SOPN, Inject 1 mg as directed once a week., Disp: 3 mL, Rfl: 3   Accu-Chek Softclix Lancets lancets, Use as instructed, Disp: 100 each, Rfl: 12   aspirin EC 81 MG tablet, Take 1 tablet (81 mg total) by mouth daily., Disp: 30 tablet, Rfl: 11   blood glucose meter kit and supplies,  Dispense based on patient and insurance preference. Use up to four times daily as directed. (FOR ICD-10 E10.9, E11.9)., Disp: 1 each, Rfl: 0   carvedilol (COREG) 3.125 MG tablet, TAKE 1 TABLET(3.125 MG) BY MOUTH TWICE DAILY WITH A MEAL, Disp: 60 tablet, Rfl: 3   Cholecalciferol 1.25 MG (50000 UT) TABS, 50,000 units PO qwk for 8 weeks., Disp: 8 tablet, Rfl: 0   hydrochlorothiazide (HYDRODIURIL) 25 MG tablet, Take 1 tablet (25 mg total) by mouth daily., Disp: 90 tablet, Rfl: 3   losartan (COZAAR) 100 MG tablet, TAKE 1 TABLET(100 MG) BY MOUTH DAILY, Disp: 90 tablet, Rfl: 3   metFORMIN (GLUCOPHAGE-XR) 500 MG 24 hr tablet, Take 2 tablets (1,000 mg total) by mouth 2 (two) times daily., Disp: 360 tablet, Rfl: 3   Multiple Vitamin (MULTIVITAMIN) tablet, Take 1 tablet by mouth daily., Disp: , Rfl:    pantoprazole (PROTONIX) 40 MG tablet, Take one 40 mg tablet by mouth daily on an empty stomach, Disp: 90 tablet, Rfl: 2   rosuvastatin (CRESTOR) 5 MG tablet, TAKE 1 TABLET(5 MG) BY MOUTH DAILY, Disp: 90 tablet, Rfl: 3  Observations/Objective: Patient is well-developed, well-nourished in no acute distress.  Resting comfortably Head is normocephalic, atraumatic.  No labored breathing.  Speech is clear and coherent with logical content.  Patient is alert and oriented at baseline.    Assessment and Plan: 1. Wound cellulitis - cephALEXin (KEFLEX) 500 MG capsule; Take 1 capsule (500 mg total) by mouth 3 (three) times daily for 10 days.  Dispense: 30 capsule; Refill: 0  2. Bee sting, accidental or unintentional, initial encounter  - Suspect cellulitis following bee sting - Keflex prescribed - Cold compresses - Benadryl oral and/or topically as needed for itching (especially at night) - Seek in person evaluation if worsening  Follow Up Instructions: I discussed the assessment and treatment plan with the patient. The patient was provided an opportunity to ask questions and all were answered. The patient  agreed with the plan and demonstrated an understanding of the instructions.  A copy of instructions were sent to the patient via MyChart unless otherwise noted below.    The patient was advised to call back or seek an in-person evaluation if the symptoms worsen or if the condition fails to improve as anticipated.  Time:  I spent 10 minutes with the patient via telehealth technology discussing the above problems/concerns.    Todd Loveless, PA-C

## 2022-10-04 NOTE — Patient Instructions (Signed)
Todd Willis, thank you for joining Margaretann Loveless, PA-C for today's virtual visit.  While this provider is not your primary care provider (PCP), if your PCP is located in our provider database this encounter information will be shared with them immediately following your visit.   A [facility] MyChart account gives you access to today's visit and all your visits, tests, and labs performed at [facility] " click here if you don't have a [facility] MyChart account or go to [email or web address]  Consent: (Patient) Todd Willis provided verbal consent for this virtual visit at the beginning of the encounter.  Current Medications:  Current Outpatient Medications:    cephALEXin (KEFLEX) 500 MG capsule, Take 1 capsule (500 mg total) by mouth 3 (three) times daily for 10 days., Disp: 30 capsule, Rfl: 0   Semaglutide, 1 MG/DOSE, 4 MG/3ML SOPN, Inject 1 mg as directed once a week., Disp: 3 mL, Rfl: 3   Accu-Chek Softclix Lancets lancets, Use as instructed, Disp: 100 each, Rfl: 12   aspirin EC 81 MG tablet, Take 1 tablet (81 mg total) by mouth daily., Disp: 30 tablet, Rfl: 11   blood glucose meter kit and supplies, Dispense based on patient and insurance preference. Use up to four times daily as directed. (FOR ICD-10 E10.9, E11.9)., Disp: 1 each, Rfl: 0   carvedilol (COREG) 3.125 MG tablet, TAKE 1 TABLET(3.125 MG) BY MOUTH TWICE DAILY WITH A MEAL, Disp: 60 tablet, Rfl: 3   Cholecalciferol 1.25 MG (50000 UT) TABS, 50,000 units PO qwk for 8 weeks., Disp: 8 tablet, Rfl: 0   hydrochlorothiazide (HYDRODIURIL) 25 MG tablet, Take 1 tablet (25 mg total) by mouth daily., Disp: 90 tablet, Rfl: 3   losartan (COZAAR) 100 MG tablet, TAKE 1 TABLET(100 MG) BY MOUTH DAILY, Disp: 90 tablet, Rfl: 3   metFORMIN (GLUCOPHAGE-XR) 500 MG 24 hr tablet, Take 2 tablets (1,000 mg total) by mouth 2 (two) times daily., Disp: 360 tablet, Rfl: 3   Multiple Vitamin (MULTIVITAMIN) tablet, Take 1 tablet by  mouth daily., Disp: , Rfl:    pantoprazole (PROTONIX) 40 MG tablet, Take one 40 mg tablet by mouth daily on an empty stomach, Disp: 90 tablet, Rfl: 2   rosuvastatin (CRESTOR) 5 MG tablet, TAKE 1 TABLET(5 MG) BY MOUTH DAILY, Disp: 90 tablet, Rfl: 3   Medications ordered in this encounter:  Meds ordered this encounter  Medications   cephALEXin (KEFLEX) 500 MG capsule    Sig: Take 1 capsule (500 mg total) by mouth 3 (three) times daily for 10 days.    Dispense:  30 capsule    Refill:  0    Order Specific Question:   Supervising Provider    Answer:   Merrilee Jansky [id number]     *If you need refills on other medications prior to your next appointment, please contact your pharmacy*  Follow-Up: Call back or seek an in-person evaluation if the symptoms worsen or if the condition fails to improve as anticipated.  [facility] [phone]  Other Instructions Bee, Wasp, or Hornet Sting, Adult Bees, wasps, and hornets are part of a family of insects that sting. Normally, a sting will cause pain, redness, and swelling at the sting site. However, some people have an allergy to these stings, and their reactions can be much more serious. What increases the risk? You may be at a greater risk of getting stung if you: Provoke a stinging insect by swatting or disturbing it. Wear strong-smelling soaps, deodorants, or  body sprays. Spend time outdoors near gardens with flowers or fruit trees or in clothes that expose skin. Eat or drink outside. What are the signs or symptoms? The reaction to an insect sting can vary from a mild, normal response to life-threatening anaphylaxis. The sting site is often a red lump in the skin, sometimes with a tiny hole in the center, that may still have the stinger in the center of the wound. Normal reaction A normal reaction is experienced by most people after an insect sting. Symptoms include: Pain, redness, and swelling at the sting site. These can  develop over 24-48 hours. Pain, redness, and swelling that may spread to a larger, connected area beyond the sting site. The spreading can continue over 24-48 hours. Allergic reaction An allergic reaction can vary in severity and includes symptoms in other areas of the body beyond the sting site. People who experience an allergic reaction have a higher risk of having similar or worse symptoms the next time they are stung. Symptoms may include: Hives, itching, and swelling. Abdominal symptoms including cramping, nausea, vomiting, and diarrhea. Severe symptoms that require immediate medical attention include: Chest pain or tightness. Wheezing or trouble breathing. Swelling of the tongue, throat, or lips. Trouble swallowing or hoarse voice. Anaphylactic reaction An anaphylactic reaction is a severe, life-threatening allergy and requires immediate medical attention. The symptoms often include severe allergic reaction symptoms that develop rapidly and lead to: A sudden and sharp drop in blood pressure. Dizziness. Loss of consciousness. How is this diagnosed? This condition is usually diagnosed based on your symptoms and medical history as well as a physical exam. You may have an allergy test to determine if you are allergic to the insect venom. How is this treated? If you were stung by a bee, the stinger and a small sac of venom may be in the wound. Remove the stinger as soon as possible. Do this by brushing across the wound with gauze, a clean fingernail, or a flat card such as a credit card. This can help reduce the severity of your body's reaction to the sting. Normal reactions can be treated with: Washing the area thoroughly with soap and water. Applying ice to the area to reduce swelling. Oral or topical medicines to help reduce pain and itching, if present. Pay close attention to your symptoms after you have been stung. If possible, have someone stay with you to see if an allergic reaction  develops. If allergic symptoms develop, oral antihistamines can be taken and you will need medical help right away. If you had an allergic reaction before, you may need to: Use an auto-injector "pen"(pre-filled automatic epinephrine injection device)at the first sign of an allergic reaction. Get medical help right away because the epinephrine is short-acting. It is intended to give you more time to get to an emergency room. Follow these instructions at home:  Wash the sting site 2-3 times a day with soap and water as told by your health care provider. Apply or take over-the-counter and prescription medicines only as told by your health care provider. If directed, apply ice to the sting area. Put ice in a plastic bag. Place a towel between your skin and the bag. Leave the ice on for 20 minutes, 2-3 times a day. Do not scratch the sting area. If you had a severe allergic reaction to a sting, you may need to: Wear a medical bracelet or necklace that lists the allergy. Carry an anaphylaxis kit or an epinephrine auto-injector "pen"  with you at all times. Tell your family members, friends, and coworkers when and how to use it. Use it at the first sign of an allergic reaction. How is this prevented? Avoid swatting at stinging insects and disturbing insect nests. Do not use fragrant soaps or lotions and avoid sitting near flowering plants, if possible. Wear shoes, pants, and long sleeves when spending time outdoors, especially in grassy areas where stinging insects are common. Keep outdoor areas free from nests or hives. Keep food and drink containers covered when eating outdoors. Wear gloves if you are gardening or working outdoors. Find a barrier between you and the insect(s), such as a door, if an attack by a stinging insect or a swarm seems likely. Contact a health care provider if: Your symptoms do not get better in 2-3 days. You have redness, swelling, or pain that spreads beyond the area of  the sting. You have a fever. Get help right away if: You have symptoms of a severe allergic reaction. These include: Chest tightness or pain. Wheezing, or trouble swallowing or breathing. Light-headedness, dizziness, or fainting. Itchy, raised, red patches on the skin beyond the sting site. Abdominal cramping, nausea, vomiting, or diarrhea. Trouble swallowing or a swollen tongue, throat, or lips. These symptoms may be an emergency. Get help right away. Call 911. Do not wait to see if the symptoms will go away. Do not drive yourself to the hospital. Summary Stings from bees, wasps, and hornets can cause pain and swelling, but they are usually not serious. However, some people may have an allergic reaction to a sting. This can cause the symptoms to be more severe. Pay close attention to your symptoms after you have been stung. If possible, have someone stay with you to look for signs of worsening symptoms. Call your health care provider if you have any signs of an allergic reaction. This information is not intended to replace advice given to you by your health care provider. Make sure you discuss any questions you have with your health care provider. Document Revised: 05/03/2021 Document Reviewed: 05/03/2021 Elsevier Patient Education  2024 Elsevier Inc.    If you have been instructed to have an in-person evaluation today at a local Urgent Care facility, please use the link below. It will take you to a list of all of our available [facility], including address, phone number and hours of operation. Please do not delay care.  [facility]  If you or a family member do not have a primary care provider, use the link below to schedule a visit and establish care. When you choose a [facility] primary care physician or advanced practice provider, you gain a long-term partner in health. Find a Primary Care Provider  Learn more about [facility] in-office and virtual  care options: [facility] Get Care Now

## 2022-10-06 DIAGNOSIS — D225 Melanocytic nevi of trunk: Secondary | ICD-10-CM | POA: Diagnosis not present

## 2022-10-06 DIAGNOSIS — L570 Actinic keratosis: Secondary | ICD-10-CM | POA: Diagnosis not present

## 2022-10-06 DIAGNOSIS — L821 Other seborrheic keratosis: Secondary | ICD-10-CM | POA: Diagnosis not present

## 2022-10-06 DIAGNOSIS — L718 Other rosacea: Secondary | ICD-10-CM | POA: Diagnosis not present

## 2022-10-13 ENCOUNTER — Ambulatory Visit: Payer: BC Managed Care – PPO | Admitting: Family

## 2022-10-13 ENCOUNTER — Encounter: Payer: Self-pay | Admitting: Family

## 2022-10-13 VITALS — BP 128/82 | HR 87 | Temp 98.3°F | Ht 75.0 in | Wt 280.0 lb

## 2022-10-13 DIAGNOSIS — Z7985 Long-term (current) use of injectable non-insulin antidiabetic drugs: Secondary | ICD-10-CM

## 2022-10-13 DIAGNOSIS — Z7984 Long term (current) use of oral hypoglycemic drugs: Secondary | ICD-10-CM | POA: Diagnosis not present

## 2022-10-13 DIAGNOSIS — L0390 Cellulitis, unspecified: Secondary | ICD-10-CM | POA: Diagnosis not present

## 2022-10-13 DIAGNOSIS — I10 Essential (primary) hypertension: Secondary | ICD-10-CM

## 2022-10-13 DIAGNOSIS — E119 Type 2 diabetes mellitus without complications: Secondary | ICD-10-CM | POA: Diagnosis not present

## 2022-10-13 DIAGNOSIS — R7309 Other abnormal glucose: Secondary | ICD-10-CM

## 2022-10-13 DIAGNOSIS — Z1211 Encounter for screening for malignant neoplasm of colon: Secondary | ICD-10-CM

## 2022-10-13 LAB — POCT GLYCOSYLATED HEMOGLOBIN (HGB A1C): Hemoglobin A1C: 6 % — AB (ref 4.0–5.6)

## 2022-10-13 MED ORDER — MUPIROCIN 2 % EX OINT
1.0000 | TOPICAL_OINTMENT | Freq: Two times a day (BID) | CUTANEOUS | 2 refills | Status: AC
Start: 2022-10-13 — End: ?

## 2022-10-13 NOTE — Progress Notes (Signed)
Assessment & Plan:  Diabetes mellitus without complication [facility]) Assessment & Plan: Lab Results  Component Value Date   HGBA1C 6.0 (A) 10/13/2022   Trial decrease metformin 1000mg  as not sure if he requires both metformin and ozempic.   Continue Ozempic to 1 mg. Discussed consideration for Mounjaro or increased Ozempic for weight loss. He will consider.    Elevated glucose -     POCT glycosylated hemoglobin (Hb A1C)  Screening for colon cancer -     Ambulatory referral to Gastroenterology  Cellulitis, unspecified cellulitis site Assessment & Plan: Resolved. Discussed preventative strategies with bactroban after initial insect bite and use of dial soap 2x per week to decrease microbial burden.   Orders: -     Mupirocin; Apply 1 Application topically 2 (two) times daily.  Dispense: 22 g; Refill: 2  Essential hypertension Assessment & Plan: Chronic, stable. Continue HCTZ 25 mg daily,  losartan 100 mg daily, Coreg 3.125 mg BID.      Return precautions given.   Risks, benefits, and alternatives of the medications and treatment plan prescribed today were discussed, and patient expressed understanding.   Education regarding symptom management and diagnosis given to patient on AVS either electronically or printed.  No follow-ups on file.  Rennie Plowman, FNP  Subjective:    Patient ID: Todd Willis, male    DOB: [birth year], 52 y.o.   MRN: [id number]  CC: Todd Willis is a 52 y.o. male who presents today for follow up.   HPI: Weight had been 315lbs. He has been as low as 272 lbs. He is disappointed that weight has platueued. He is tolerating ozempic 1mg  without nausa.        He was stung by wasp one week ago and resulted in itching, red infection. He was treated with cephalexin with resolution.   No lip swelling, sob, cough.   No h/o anaphylaxis.   He reports skin infection occurs annually and tends to be always right foot; previous summers, infection from  fire ants, sand fleas.   He would to consider cologuard versus colonoscopy. No family h/o colon cancer.    Allergies: Patient has no known allergies. Current Outpatient Medications on File Prior to Visit  Medication Sig Dispense Refill   Accu-Chek Softclix Lancets lancets Use as instructed 100 each 12   aspirin EC 81 MG tablet Take 1 tablet (81 mg total) by mouth daily. 30 tablet 11   blood glucose meter kit and supplies Dispense based on patient and insurance preference. Use up to four times daily as directed. (FOR ICD-10 E10.9, E11.9). 1 each 0   carvedilol (COREG) 3.125 MG tablet TAKE 1 TABLET(3.125 MG) BY MOUTH TWICE DAILY WITH A MEAL 60 tablet 3   cephALEXin (KEFLEX) 500 MG capsule Take 1 capsule (500 mg total) by mouth 3 (three) times daily for 10 days. 30 capsule 0   hydrochlorothiazide (HYDRODIURIL) 25 MG tablet Take 1 tablet (25 mg total) by mouth daily. 90 tablet 3   losartan (COZAAR) 100 MG tablet TAKE 1 TABLET(100 MG) BY MOUTH DAILY 90 tablet 3   Multiple Vitamin (MULTIVITAMIN) tablet Take 1 tablet by mouth daily.     pantoprazole (PROTONIX) 40 MG tablet Take one 40 mg tablet by mouth daily on an empty stomach 90 tablet 2   rosuvastatin (CRESTOR) 5 MG tablet TAKE 1 TABLET(5 MG) BY MOUTH DAILY 90 tablet 3   Semaglutide, 1 MG/DOSE, 4 MG/3ML SOPN Inject 1 mg as directed once a week. 3 mL 3  Cholecalciferol 1.25 MG (50000 UT) TABS 50,000 units PO qwk for 8 weeks. (Patient not taking: Reported on 10/13/2022) 8 tablet 0   metFORMIN (GLUCOPHAGE-XR) 500 MG 24 hr tablet Take 2 tablets (1,000 mg total) by mouth 2 (two) times daily. 360 tablet 3   No current facility-administered medications on file prior to visit.    Review of Systems  Constitutional:  Negative for chills and fever.  HENT:  Negative for trouble swallowing.   Respiratory:  Negative for cough and shortness of breath.   Cardiovascular:  Negative for chest pain and palpitations.  Gastrointestinal:  Negative for nausea and  vomiting.  Skin:  Negative for rash (resolved).      Objective:    BP 128/82   Pulse 87   Temp 98.3 F (36.8 C) (Oral)   Ht 6\' 3"  (1.905 m)   Wt 280 lb (127 kg)   SpO2 98%   BMI 35.00 kg/m  BP Readings from Last 3 Encounters:  10/13/22 128/82  07/13/22 126/80  05/12/22 128/82   Wt Readings from Last 3 Encounters:  10/13/22 280 lb (127 kg)  07/13/22 274 lb 3.2 oz (124.4 kg)  05/12/22 284 lb 12.8 oz (129.2 kg)    Physical Exam Vitals reviewed.  Constitutional:      Appearance: He is well-developed.  Cardiovascular:     Rate and Rhythm: Regular rhythm.     Heart sounds: Normal heart sounds.  Pulmonary:     Effort: Pulmonary effort is normal. No respiratory distress.     Breath sounds: Normal breath sounds. No wheezing, rhonchi or rales.  Skin:    General: Skin is warm and dry.     Comments: No rash RLE. Skin intact.   Neurological:     Mental Status: He is alert.  Psychiatric:        Speech: Speech normal.        Behavior: Behavior normal.

## 2022-10-13 NOTE — Patient Instructions (Addendum)
Bactroban if needed  Dial soap on lower legs 2 days per week  Let me know about cologuard or colonoscopy

## 2022-10-13 NOTE — Assessment & Plan Note (Addendum)
Lab Results  Component Value Date   HGBA1C 6.0 (A) 10/13/2022   Trial decrease metformin 1000mg  as not sure if he requires both metformin and ozempic.   Continue Ozempic to 1 mg. Discussed consideration for Mounjaro or increased Ozempic for weight loss. He will consider.

## 2022-10-13 NOTE — Assessment & Plan Note (Signed)
Resolved. Discussed preventative strategies with bactroban after initial insect bite and use of dial soap 2x per week to decrease microbial burden.

## 2022-10-13 NOTE — Assessment & Plan Note (Signed)
Chronic, stable. Continue HCTZ 25 mg daily,  losartan 100 mg daily, Coreg 3.125 mg BID. 

## 2022-10-14 ENCOUNTER — Other Ambulatory Visit: Payer: Self-pay | Admitting: Family

## 2022-10-14 DIAGNOSIS — I10 Essential (primary) hypertension: Secondary | ICD-10-CM

## 2022-10-20 ENCOUNTER — Encounter: Payer: Self-pay | Admitting: *Deleted

## 2022-11-16 ENCOUNTER — Telehealth: Payer: Self-pay | Admitting: Family

## 2022-11-16 ENCOUNTER — Other Ambulatory Visit: Payer: Self-pay | Admitting: Family

## 2022-11-16 MED ORDER — OZEMPIC (1 MG/DOSE) 4 MG/3ML ~~LOC~~ SOPN: PEN_INJECTOR | SUBCUTANEOUS | 3 refills | Status: DC

## 2022-11-16 NOTE — Addendum Note (Signed)
Addended by: [place], Tahj Lindseth on: 11/16/2022 04:29 PM   Modules accepted: Orders

## 2022-11-16 NOTE — Telephone Encounter (Signed)
Prescription Request  11/16/2022  LOV: 10/13/2022  What is the name of the medication or equipment? Ozempic 1mg   Have you contacted your pharmacy to request a refill? Yes   Which pharmacy would you like this sent to?  [id number] [place], [place] - [address] AT [facility] OF [address] [place] [address] Phone: [phone] Fax: [phone]     Patient notified that their request is being sent to the clinical staff for review and that they should receive a response within 2 business days.   Please advise at [facility] [phone]

## 2023-01-13 ENCOUNTER — Other Ambulatory Visit: Payer: Self-pay | Admitting: Family

## 2023-01-13 DIAGNOSIS — K219 Gastro-esophageal reflux disease without esophagitis: Secondary | ICD-10-CM

## 2023-01-19 ENCOUNTER — Encounter: Payer: Self-pay | Admitting: Family

## 2023-01-19 ENCOUNTER — Other Ambulatory Visit: Payer: Self-pay | Admitting: Family

## 2023-01-19 ENCOUNTER — Ambulatory Visit (INDEPENDENT_AMBULATORY_CARE_PROVIDER_SITE_OTHER): Payer: BC Managed Care – PPO | Admitting: Family

## 2023-01-19 VITALS — BP 122/78 | HR 92 | Temp 98.4°F | Ht 75.0 in | Wt 286.2 lb

## 2023-01-19 DIAGNOSIS — E119 Type 2 diabetes mellitus without complications: Secondary | ICD-10-CM | POA: Diagnosis not present

## 2023-01-19 DIAGNOSIS — I10 Essential (primary) hypertension: Secondary | ICD-10-CM

## 2023-01-19 DIAGNOSIS — Z7985 Long-term (current) use of injectable non-insulin antidiabetic drugs: Secondary | ICD-10-CM | POA: Diagnosis not present

## 2023-01-19 DIAGNOSIS — Z1211 Encounter for screening for malignant neoplasm of colon: Secondary | ICD-10-CM

## 2023-01-19 DIAGNOSIS — R42 Dizziness and giddiness: Secondary | ICD-10-CM | POA: Diagnosis not present

## 2023-01-19 DIAGNOSIS — R7309 Other abnormal glucose: Secondary | ICD-10-CM

## 2023-01-19 LAB — POCT GLYCOSYLATED HEMOGLOBIN (HGB A1C): Hemoglobin A1C: 6.1 % — AB (ref 4.0–5.6)

## 2023-01-19 NOTE — Assessment & Plan Note (Signed)
Lab Results  Component Value Date   HGBA1C 6.0 (A) 10/13/2022  Continue metformin 1000 mg daily, Ozempic 1 mg daily.  Patient may opt to increase metformin back to previous dose of 2000 mg daily.  We also discussed increasing Ozempic to 2 mg daily or pursuing [facility]

## 2023-01-19 NOTE — Progress Notes (Unsigned)
Assessment & Plan:  Elevated glucose -     POCT glycosylated hemoglobin (Hb A1C)  Screening for colon cancer -     Cologuard  Diabetes mellitus without complication [facility]) Assessment & Plan: Lab Results  Component Value Date   HGBA1C 6.0 (A) 10/13/2022  Continue metformin 1000 mg daily, Ozempic 1 mg daily.  Patient may opt to increase metformin back to previous dose of 2000 mg daily.  We also discussed increasing Ozempic to 2 mg daily or pursuing Mounjaro   Dizziness Assessment & Plan: No dizziness today.  Reassuring HEENT, neurologic exam.  We did discuss whether patient is experiencing dizziness versus vertigo.  History of allergies.  He was not orthostatic on exam.  I rechecked orthostatic blood pressure again, myself, sitting and standing.  Blood pressure did not go up upon standing as noted by CMA. (See flow sheet) Advised to keep a log in regards to precipitating features, pattern he may notice.  Patient will trial over-the-counter antihistamine.  Encouraged adequate hydration.    Essential hypertension Assessment & Plan: Chronic, stable. Continue HCTZ 25 mg daily,  losartan 100 mg daily, Coreg 3.125 mg BID.  In the setting of dizziness, we discussed particularly as he has lost weight, decreasing antihypertensive.  If dizziness persists, will recommend trial decrease of Coreg 3.125 mg twice daily.       Return precautions given.   Risks, benefits, and alternatives of the medications and treatment plan prescribed today were discussed, and patient expressed understanding.   Education regarding symptom management and diagnosis given to patient on AVS either electronically or printed.  Return in about 3 months (around 04/21/2023).  Rennie Plowman, FNP  Subjective:    Patient ID: Todd Willis, male    DOB: [birth year], 52 y.o.   MRN: [id number]  CC: Todd Willis is a 52 y.o. male who presents today for follow up.   HPI: Patient has lost approximately 30 pounds over  the past couple of years.  He is compliant with hydrochlorothiazide 25 mg daily, losartan 100 mg daily, Coreg 3.125 mg twice daily.  He reports occasional episodes usually when walking or he may feel a brief self limited episode of dizziness.  He does not necessary feel the room is spinning.  He does have allergies which have become more bothersome of late.   No associated shortness of breath, chest pain, syncope, headache, vision changes  Eye exam is up-to-date  He notes dietary indiscretion over the past couple of months  No family history of colon cancer    Allergies: Patient has no known allergies.  Review of Systems  Constitutional:  Negative for chills and fever.  HENT:  Positive for rhinorrhea.   Eyes:  Negative for visual disturbance.  Respiratory:  Negative for cough.   Cardiovascular:  Negative for chest pain and palpitations.  Gastrointestinal:  Negative for nausea and vomiting.  Neurological:  Positive for dizziness. Negative for numbness and headaches.      Objective:    BP 122/78   Pulse 92   Temp 98.4 F (36.9 C) (Oral)   Ht 6\' 3"  (1.905 m)   Wt 286 lb 3.2 oz (129.8 kg)   SpO2 99%   BMI 35.77 kg/m  BP Readings from Last 3 Encounters:  01/19/23 122/78  10/13/22 128/82  07/13/22 126/80   Wt Readings from Last 3 Encounters:  01/19/23 286 lb 3.2 oz (129.8 kg)  10/13/22 280 lb (127 kg)  07/13/22 274 lb 3.2 oz (124.4 kg)   Sitting  120/90 Standing 120/80    Physical Exam Vitals reviewed.  Constitutional:      Appearance: He is well-developed.  HENT:     Head: Normocephalic and atraumatic.     Right Ear: Hearing, tympanic membrane, ear canal and external ear normal. No decreased hearing noted. No drainage, swelling or tenderness. No middle ear effusion. Tympanic membrane is not injected, erythematous or bulging.     Left Ear: Hearing, tympanic membrane, ear canal and external ear normal. No decreased hearing noted. No drainage, swelling or tenderness.   No middle ear effusion. Tympanic membrane is not injected, erythematous or bulging.     Nose: Nose normal.     Right Sinus: No maxillary sinus tenderness or frontal sinus tenderness.     Left Sinus: No maxillary sinus tenderness or frontal sinus tenderness.     Mouth/Throat:     Pharynx: Uvula midline. No oropharyngeal exudate or posterior oropharyngeal erythema.     Tonsils: No tonsillar abscesses.  Eyes:     General: Lids are normal. Lids are everted, no foreign bodies appreciated.     Conjunctiva/sclera: Conjunctivae normal.     Pupils: Pupils are equal, round, and reactive to light.     Comments: Normal fundus bilaterally   Cardiovascular:     Rate and Rhythm: Regular rhythm.     Heart sounds: Normal heart sounds.  Pulmonary:     Effort: Pulmonary effort is normal. No respiratory distress.     Breath sounds: Normal breath sounds. No wheezing, rhonchi or rales.  Lymphadenopathy:     Head:     Right side of head: No submental, submandibular, tonsillar, preauricular, posterior auricular or occipital adenopathy.     Left side of head: No submental, submandibular, tonsillar, preauricular, posterior auricular or occipital adenopathy.     Cervical: No cervical adenopathy.  Skin:    General: Skin is warm and dry.  Neurological:     Mental Status: He is alert.     Cranial Nerves: No cranial nerve deficit.     Sensory: No sensory deficit.     Deep Tendon Reflexes:     Reflex Scores:      Bicep reflexes are 2+ on the right side and 2+ on the left side.      Patellar reflexes are 2+ on the right side and 2+ on the left side.    Comments: Grip equal and strong bilateral upper extremities. Gait strong and steady. Able to perform  finger-to-nose without difficulty.   Psychiatric:        Speech: Speech normal.        Behavior: Behavior normal.

## 2023-01-19 NOTE — Patient Instructions (Addendum)
We have ordered the Cologuard test for you. Please let us know if you not receive a kit from the company in the next 2 to 3 weeks. Please keep a log of the dizziness and let me know if persist and what features surround it.  Please start over-the-counter antihistamine to see if allergies are contributing.  Ensure you are drinking plenty of water as well

## 2023-01-19 NOTE — Assessment & Plan Note (Signed)
No dizziness today.  Reassuring HEENT, neurologic exam.  We did discuss whether patient is experiencing dizziness versus vertigo.  History of allergies.  He was not orthostatic on exam.  I rechecked orthostatic blood pressure again, myself, sitting and standing.  Blood pressure did not go up upon standing as noted by CMA. (See flow sheet) Advised to keep a log in regards to precipitating features, pattern he may notice.  Patient will trial over-the-counter antihistamine.  Encouraged adequate hydration.

## 2023-01-22 NOTE — Assessment & Plan Note (Signed)
Chronic, stable. Continue HCTZ 25 mg daily,  losartan 100 mg daily, Coreg 3.125 mg BID.  In the setting of dizziness, we discussed particularly as he has lost weight, decreasing antihypertensive.  If dizziness persists, will recommend trial decrease of Coreg 3.125 mg twice daily.

## 2023-01-28 ENCOUNTER — Other Ambulatory Visit: Payer: Self-pay | Admitting: Family

## 2023-01-28 DIAGNOSIS — E119 Type 2 diabetes mellitus without complications: Secondary | ICD-10-CM

## 2023-01-29 DIAGNOSIS — Z1211 Encounter for screening for malignant neoplasm of colon: Secondary | ICD-10-CM | POA: Diagnosis not present

## 2023-02-07 LAB — COLOGUARD: COLOGUARD: NEGATIVE

## 2023-03-05 ENCOUNTER — Other Ambulatory Visit: Payer: Self-pay | Admitting: Family

## 2023-03-05 DIAGNOSIS — I10 Essential (primary) hypertension: Secondary | ICD-10-CM

## 2023-03-08 ENCOUNTER — Encounter: Payer: Self-pay | Admitting: Family

## 2023-03-08 ENCOUNTER — Telehealth (INDEPENDENT_AMBULATORY_CARE_PROVIDER_SITE_OTHER): Payer: BC Managed Care – PPO | Admitting: Family

## 2023-03-08 DIAGNOSIS — Z7985 Long-term (current) use of injectable non-insulin antidiabetic drugs: Secondary | ICD-10-CM | POA: Diagnosis not present

## 2023-03-08 DIAGNOSIS — E119 Type 2 diabetes mellitus without complications: Secondary | ICD-10-CM

## 2023-03-08 MED ORDER — TIRZEPATIDE 5 MG/0.5ML ~~LOC~~ SOAJ
5.0000 mg | SUBCUTANEOUS | 2 refills | Status: DC
Start: 2023-03-08 — End: 2023-04-27

## 2023-03-08 NOTE — Progress Notes (Signed)
Virtual Visit via Video Note  I connected with Todd Willis on 03/10/23 at  3:30 PM EST by a video enabled telemedicine application and verified that I am speaking with the correct person using two identifiers. Location patient: home Location provider: work  Persons participating in the virtual visit: patient, provider  I discussed the limitations of evaluation and management by telemedicine and the availability of in person appointments. The patient expressed understanding and agreed to proceed.  HPI: Feels well today.  No new concerns.  He is interested in trial of mounjaro for additional benefit of weight loss.    Weight loss has plateued on Ozempic 1mg .    Lab Results  Component Value Date   HGBA1C 6.1 (A) 01/19/2023   Cologuard 01/29/23  ROS: See pertinent positives and negatives per HPI.  EXAM:  VITALS per patient if applicable: There were no vitals taken for this visit. BP Readings from Last 3 Encounters:  01/19/23 122/78  10/13/22 128/82  07/13/22 126/80   Wt Readings from Last 3 Encounters:  01/19/23 286 lb 3.2 oz (129.8 kg)  10/13/22 280 lb (127 kg)  07/13/22 274 lb 3.2 oz (124.4 kg)    GENERAL: alert, oriented, appears well and in no acute distress  HEENT: atraumatic, conjunttiva clear, no obvious abnormalities on inspection of external nose and ears  NECK: normal movements of the head and neck  LUNGS: on inspection no signs of respiratory distress, breathing rate appears normal, no obvious gross SOB, gasping or wheezing  CV: no obvious cyanosis  MS: moves all visible extremities without noticeable abnormality  PSYCH/NEURO: pleasant and cooperative, no obvious depression or anxiety, speech and thought processing grossly intact  ASSESSMENT AND PLAN: Diabetes mellitus without complication (HCC) Assessment & Plan: A1c under excellent control.  Stop Ozempic 1 mg and start Mounjaro 5 mg for additional benefit of weight loss.  Counseled on side effects,  mechanism of action  Orders: -     Tirzepatide; Inject 5 mg into the skin once a week.  Dispense: 6 mL; Refill: 2     -we discussed possible serious and likely etiologies, options for evaluation and workup, limitations of telemedicine visit vs in person visit, treatment, treatment risks and precautions. Pt prefers to treat via telemedicine empirically rather then risking or undertaking an in person visit at this moment.    I discussed the assessment and treatment plan with the patient. The patient was provided an opportunity to ask questions and all were answered. The patient agreed with the plan and demonstrated an understanding of the instructions.   The patient was advised to call back or seek an in-person evaluation if the symptoms worsen or if the condition fails to improve as anticipated.  Advised if desired AVS can be mailed or viewed via MyChart if Mychart user.   Rennie Plowman, FNP

## 2023-03-10 NOTE — Assessment & Plan Note (Signed)
A1c under excellent control.  Stop Ozempic 1 mg and start Mounjaro 5 mg for additional benefit of weight loss.  Counseled on side effects, mechanism of action

## 2023-04-27 ENCOUNTER — Encounter: Payer: Self-pay | Admitting: Family

## 2023-04-27 ENCOUNTER — Ambulatory Visit (INDEPENDENT_AMBULATORY_CARE_PROVIDER_SITE_OTHER): Payer: BC Managed Care – PPO | Admitting: Family

## 2023-04-27 VITALS — BP 125/86 | HR 86 | Temp 98.5°F | Ht 75.0 in | Wt 296.6 lb

## 2023-04-27 DIAGNOSIS — I10 Essential (primary) hypertension: Secondary | ICD-10-CM | POA: Diagnosis not present

## 2023-04-27 DIAGNOSIS — R7309 Other abnormal glucose: Secondary | ICD-10-CM

## 2023-04-27 DIAGNOSIS — Z13220 Encounter for screening for lipoid disorders: Secondary | ICD-10-CM | POA: Diagnosis not present

## 2023-04-27 DIAGNOSIS — Z7985 Long-term (current) use of injectable non-insulin antidiabetic drugs: Secondary | ICD-10-CM

## 2023-04-27 DIAGNOSIS — E119 Type 2 diabetes mellitus without complications: Secondary | ICD-10-CM

## 2023-04-27 DIAGNOSIS — Z7984 Long term (current) use of oral hypoglycemic drugs: Secondary | ICD-10-CM

## 2023-04-27 DIAGNOSIS — Z125 Encounter for screening for malignant neoplasm of prostate: Secondary | ICD-10-CM | POA: Diagnosis not present

## 2023-04-27 DIAGNOSIS — Z136 Encounter for screening for cardiovascular disorders: Secondary | ICD-10-CM

## 2023-04-27 LAB — POCT GLYCOSYLATED HEMOGLOBIN (HGB A1C): Hemoglobin A1C: 6.8 % — AB (ref 4.0–5.6)

## 2023-04-27 MED ORDER — METFORMIN HCL ER 500 MG PO TB24: 500.0000 mg | ORAL_TABLET | Freq: Two times a day (BID) | ORAL | 3 refills | Status: DC

## 2023-04-27 MED ORDER — TIRZEPATIDE 7.5 MG/0.5ML ~~LOC~~ SOAJ: 7.5000 mg | SUBCUTANEOUS | 2 refills | Status: DC

## 2023-04-27 MED ORDER — LOSARTAN POTASSIUM 100 MG PO TABS
ORAL_TABLET | ORAL | 3 refills | Status: DC
Start: 2023-04-27 — End: 2023-07-12

## 2023-04-27 NOTE — Assessment & Plan Note (Signed)
 Chronic,  overall stable. Continue HCTZ 25 mg daily,  losartan  100 mg daily, Coreg  3.125 mg BID.

## 2023-04-27 NOTE — Progress Notes (Signed)
 Assessment & Plan:  Diabetes mellitus without complication (HCC) Assessment & Plan: Slightly increased today.  Increase Mounjaro  to 7.5 mg.  He will also increase metformin  to 1000 mg twice daily for the interim.   Orders: -     Tirzepatide ; Inject 7.5 mg into the skin once a week.  Dispense: 6 mL; Refill: 2 -     Microalbumin / creatinine urine ratio; Future -     TSH; Future -     metFORMIN  HCl ER; Take 1 tablet (500 mg total) by mouth 2 (two) times daily.  Dispense: 360 tablet; Refill: 3  Elevated glucose -     POCT glycosylated hemoglobin (Hb A1C)  Essential hypertension Assessment & Plan: Chronic,  overall stable. Continue HCTZ 25 mg daily,  losartan  100 mg daily, Coreg  3.125 mg BID.    Orders: -     Losartan  Potassium; TAKE ONE 100 MG TABLET BY MOUTH DAILY  Dispense: 90 tablet; Refill: 3 -     CBC with Differential/Platelet; Future -     Comprehensive metabolic panel; Future -     Hemoglobin A1c; Future -     VITAMIN D  25 Hydroxy (Vit-D Deficiency, Fractures); Future  Encounter for lipid screening for cardiovascular disease  Screening for prostate cancer -     PSA; Future     Return precautions given.   Risks, benefits, and alternatives of the medications and treatment plan prescribed today were discussed, and patient expressed understanding.   Education regarding symptom management and diagnosis given to patient on AVS either electronically or printed.  Return in about 3 months (around 07/25/2023).  Rollene Northern, FNP  Subjective:    Patient ID: Todd Willis, male    DOB: February 20, 1971, 53 y.o.   MRN: [id number]  CC: Todd Willis is a 53 y.o. male who presents today for follow up.   HPI: Feels well today. No new complaints  Tolerating Mounjaro  5 mg well.  He also reduced metformin  to 500 mg twice daily after last A1c. Denies constipation.    Allergies: Patient has no known allergies. Current Outpatient Medications on File Prior to Visit  Medication  Sig Dispense Refill   Accu-Chek Softclix Lancets lancets Use as instructed 100 each 12   aspirin  EC 81 MG tablet Take 1 tablet (81 mg total) by mouth daily. 30 tablet 11   blood glucose meter kit and supplies Dispense based on patient and insurance preference. Use up to four times daily as directed. (FOR ICD-10 E10.9, E11.9). 1 each 0   carvedilol  (COREG ) 3.125 MG tablet TAKE 1 TABLET(3.125 MG) BY MOUTH TWICE DAILY WITH A MEAL 60 tablet 3   hydrochlorothiazide  (HYDRODIURIL ) 25 MG tablet Take 1 tablet (25 mg total) by mouth daily. 90 tablet 3   Multiple Vitamin (MULTIVITAMIN) tablet Take 1 tablet by mouth daily.     mupirocin  ointment (BACTROBAN ) 2 % Apply 1 Application topically 2 (two) times daily. 22 g 2   pantoprazole  (PROTONIX ) 40 MG tablet TAKE 1 TABLET(40 MG) BY MOUTH DAILY ON AN EMPTY STOMACH 90 tablet 1   rosuvastatin  (CRESTOR ) 5 MG tablet TAKE 1 TABLET(5 MG) BY MOUTH DAILY 90 tablet 3   Cholecalciferol  1.25 MG (50000 UT) TABS 50,000 units PO qwk for 8 weeks. (Patient not taking: Reported on 04/27/2023) 8 tablet 0   No current facility-administered medications on file prior to visit.    Review of Systems  Constitutional:  Negative for chills and fever.  Respiratory:  Negative for cough.   Cardiovascular:  Negative for chest pain and palpitations.  Gastrointestinal:  Negative for nausea and vomiting.      Objective:    BP 125/86   Pulse 86   Temp 98.5 F (36.9 C) (Oral)   Ht 6' 3 (1.905 m)   Wt 296 lb 9.6 oz (134.5 kg)   SpO2 98%   BMI 37.07 kg/m  BP Readings from Last 3 Encounters:  04/27/23 125/86  01/19/23 122/78  10/13/22 128/82   Wt Readings from Last 3 Encounters:  04/27/23 296 lb 9.6 oz (134.5 kg)  01/19/23 286 lb 3.2 oz (129.8 kg)  10/13/22 280 lb (127 kg)    Physical Exam Vitals reviewed.  Constitutional:      Appearance: He is well-developed.  Cardiovascular:     Rate and Rhythm: Regular rhythm.     Heart sounds: Normal heart sounds.  Pulmonary:      Effort: Pulmonary effort is normal. No respiratory distress.     Breath sounds: Normal breath sounds. No wheezing, rhonchi or rales.  Skin:    General: Skin is warm and dry.  Neurological:     Mental Status: He is alert.  Psychiatric:        Speech: Speech normal.        Behavior: Behavior normal.

## 2023-04-27 NOTE — Assessment & Plan Note (Signed)
 Slightly increased today.  Increase Mounjaro to 7.5 mg.  He will also increase metformin  to 1000 mg twice daily for the interim.

## 2023-05-21 ENCOUNTER — Encounter: Payer: Self-pay | Admitting: Family

## 2023-05-21 DIAGNOSIS — E119 Type 2 diabetes mellitus without complications: Secondary | ICD-10-CM

## 2023-05-22 NOTE — Telephone Encounter (Signed)
 Pt requesting 10mg  of Mounjaro. Medication has been pended

## 2023-05-23 MED ORDER — TIRZEPATIDE 10 MG/0.5ML ~~LOC~~ SOAJ: 10.0000 mg | SUBCUTANEOUS | 2 refills | Status: DC

## 2023-05-29 ENCOUNTER — Encounter: Payer: Self-pay | Admitting: Family

## 2023-06-09 ENCOUNTER — Other Ambulatory Visit: Payer: Self-pay | Admitting: Family

## 2023-06-09 DIAGNOSIS — I10 Essential (primary) hypertension: Secondary | ICD-10-CM

## 2023-06-12 ENCOUNTER — Encounter: Payer: Self-pay | Admitting: Family

## 2023-06-13 ENCOUNTER — Other Ambulatory Visit: Payer: Self-pay

## 2023-06-13 DIAGNOSIS — I10 Essential (primary) hypertension: Secondary | ICD-10-CM

## 2023-06-13 MED ORDER — CARVEDILOL 3.125 MG PO TABS: ORAL_TABLET | ORAL | 3 refills | Status: DC

## 2023-07-11 ENCOUNTER — Encounter: Payer: Self-pay | Admitting: Family

## 2023-07-12 ENCOUNTER — Other Ambulatory Visit: Payer: Self-pay | Admitting: Family

## 2023-07-12 MED ORDER — LOSARTAN POTASSIUM-HCTZ 100-25 MG PO TABS: 1.0000 | ORAL_TABLET | Freq: Every day | ORAL | 3 refills | Status: DC

## 2023-07-16 ENCOUNTER — Telehealth: Payer: Self-pay | Admitting: Family

## 2023-07-16 NOTE — Telephone Encounter (Signed)
 Left message to call the office to confirm appointment change on 08/03/23 to 11:00 a.m.

## 2023-07-25 ENCOUNTER — Encounter: Payer: Self-pay | Admitting: Family

## 2023-07-27 ENCOUNTER — Encounter (HOSPITAL_COMMUNITY): Payer: Self-pay

## 2023-07-31 ENCOUNTER — Other Ambulatory Visit: Payer: BC Managed Care – PPO

## 2023-08-01 ENCOUNTER — Other Ambulatory Visit (INDEPENDENT_AMBULATORY_CARE_PROVIDER_SITE_OTHER): Payer: BC Managed Care – PPO

## 2023-08-01 DIAGNOSIS — E119 Type 2 diabetes mellitus without complications: Secondary | ICD-10-CM | POA: Diagnosis not present

## 2023-08-01 DIAGNOSIS — I10 Essential (primary) hypertension: Secondary | ICD-10-CM | POA: Diagnosis not present

## 2023-08-01 LAB — CBC WITH DIFFERENTIAL/PLATELET
Basophils Absolute: 0 10*3/uL (ref 0.0–0.1)
Basophils Relative: 0.3 % (ref 0.0–3.0)
Eosinophils Absolute: 0.2 10*3/uL (ref 0.0–0.7)
Eosinophils Relative: 1.9 % (ref 0.0–5.0)
HCT: 45.4 % (ref 39.0–52.0)
Hemoglobin: 15.5 g/dL (ref 13.0–17.0)
Lymphocytes Relative: 24.8 % (ref 12.0–46.0)
Lymphs Abs: 2.1 10*3/uL (ref 0.7–4.0)
MCHC: 34.1 g/dL (ref 30.0–36.0)
MCV: 85.2 fl (ref 78.0–100.0)
Monocytes Absolute: 0.6 10*3/uL (ref 0.1–1.0)
Monocytes Relative: 7.6 % (ref 3.0–12.0)
Neutro Abs: 5.4 10*3/uL (ref 1.4–7.7)
Neutrophils Relative %: 65.4 % (ref 43.0–77.0)
Platelets: 241 10*3/uL (ref 150.0–400.0)
RBC: 5.33 Mil/uL (ref 4.22–5.81)
RDW: 14 % (ref 11.5–15.5)
WBC: 8.3 10*3/uL (ref 4.0–10.5)

## 2023-08-01 LAB — COMPREHENSIVE METABOLIC PANEL WITH GFR
ALT: 41 U/L (ref 0–53)
AST: 32 U/L (ref 0–37)
Albumin: 4.7 g/dL (ref 3.5–5.2)
Alkaline Phosphatase: 46 U/L (ref 39–117)
BUN: 20 mg/dL (ref 6–23)
CO2: 29 meq/L (ref 19–32)
Calcium: 10.1 mg/dL (ref 8.4–10.5)
Chloride: 98 meq/L (ref 96–112)
Creatinine, Ser: 1.1 mg/dL (ref 0.40–1.50)
GFR: 77 mL/min (ref >60.00)
Glucose, Bld: 135 mg/dL — ABNORMAL HIGH (ref 70–99)
Potassium: 4.1 meq/L (ref 3.5–5.1)
Sodium: 139 meq/L (ref 135–145)
Total Bilirubin: 0.9 mg/dL (ref 0.2–1.2)
Total Protein: 7.4 g/dL (ref 6.0–8.3)

## 2023-08-01 LAB — VITAMIN D 25 HYDROXY (VIT D DEFICIENCY, FRACTURES): VITD: 31.88 ng/mL (ref 30.00–100.00)

## 2023-08-01 LAB — MICROALBUMIN / CREATININE URINE RATIO
Creatinine,U: 101.7 mg/dL
Microalb Creat Ratio: 11.2 mg/g (ref 0.0–30.0)
Microalb, Ur: 1.1 mg/dL (ref 0.0–1.9)

## 2023-08-01 LAB — TSH: TSH: 2.99 u[IU]/mL (ref 0.35–5.50)

## 2023-08-01 LAB — HEMOGLOBIN A1C: Hgb A1c MFr Bld: 6.2 % (ref 4.6–6.5)

## 2023-08-02 ENCOUNTER — Other Ambulatory Visit: Payer: Self-pay | Admitting: Family

## 2023-08-02 DIAGNOSIS — E119 Type 2 diabetes mellitus without complications: Secondary | ICD-10-CM | POA: Diagnosis not present

## 2023-08-02 DIAGNOSIS — K219 Gastro-esophageal reflux disease without esophagitis: Secondary | ICD-10-CM

## 2023-08-03 ENCOUNTER — Encounter: Payer: Self-pay | Admitting: Family

## 2023-08-03 ENCOUNTER — Ambulatory Visit: Payer: BC Managed Care – PPO | Admitting: Family

## 2023-08-03 VITALS — BP 118/90 | HR 88 | Temp 98.2°F | Ht 75.0 in | Wt 284.2 lb

## 2023-08-03 DIAGNOSIS — K219 Gastro-esophageal reflux disease without esophagitis: Secondary | ICD-10-CM | POA: Diagnosis not present

## 2023-08-03 DIAGNOSIS — I10 Essential (primary) hypertension: Secondary | ICD-10-CM | POA: Diagnosis not present

## 2023-08-03 DIAGNOSIS — E119 Type 2 diabetes mellitus without complications: Secondary | ICD-10-CM | POA: Diagnosis not present

## 2023-08-03 DIAGNOSIS — Z7985 Long-term (current) use of injectable non-insulin antidiabetic drugs: Secondary | ICD-10-CM | POA: Diagnosis not present

## 2023-08-03 MED ORDER — ROSUVASTATIN CALCIUM 5 MG PO TABS: ORAL_TABLET | ORAL | 3 refills | Status: AC

## 2023-08-03 MED ORDER — PANTOPRAZOLE SODIUM 40 MG PO TBEC: DELAYED_RELEASE_TABLET | ORAL | 3 refills | Status: AC

## 2023-08-03 NOTE — Assessment & Plan Note (Signed)
 Improved, excellent control.  Diarrhea resolved after first dose of Mounjaro.  Continue Mounjaro 10 mg once weekly.  Discussed no more than 2 pounds of weight loss per week. Continue metformin  1000 mg twice daily

## 2023-08-03 NOTE — Progress Notes (Signed)
 Assessment & Plan:   Essential hypertension Assessment & Plan: Chronic,  overall stable.  Discussed elevation of diastolic blood pressure.  Reviewed previous echocardiogram, with known grade 1 diastolic dysfunction.  With soft systolic blood pressure,  we would defer antihypertensive changes at this time and continue to monitor .  continue HCTZ 25 mg -  losartan  100 mg daily, Coreg  3.125 mg BID.     Gastroesophageal reflux disease -     Pantoprazole  Sodium; TAKE 1 TABLET(40 MG) BY MOUTH DAILY ON AN EMPTY STOMACH  Dispense: 90 tablet; Refill: 3  Diabetes mellitus without complication (HCC) Assessment & Plan: Improved, excellent control.  Diarrhea resolved after first dose of Mounjaro.  Continue Mounjaro 10 mg once weekly.  Discussed no more than 2 pounds of weight loss per week. Continue metformin  1000 mg twice daily  Orders: -     Rosuvastatin  Calcium ; TAKE  1 TABLET BY MOUTH DAILY  Dispense: 90 tablet; Refill: 3     Return precautions given.   Risks, benefits, and alternatives of the medications and treatment plan prescribed today were discussed, and patient expressed understanding.   Education regarding symptom management and diagnosis given to patient on AVS either electronically or printed.  No follow-ups on file.  Bascom Bossier, FNP  Subjective:    Patient ID: Todd Willis, male    DOB: 07-27-1970, 53 y.o.   MRN: [id number]  CC: Todd Willis is a 53 y.o. male who presents today for follow up.   HPI: Feels well today.  No new complaints.  On Mounjaro 10 mg, first week of new dose, he had diarrhea, vomiting.  Since resolved.  This has been occurring with each dose escalation with Mounjaro , and previously with Ozempic .  After that first week he tolerated medication.   Occasional use of MiraLAX .  Overall pleased with medication.  echocardiogram 07/04/2021 left ventricular ejection fraction 60 to 65%, grade 1 diastolic dysfunction.  No significant valvular  disease  Allergies: Patient has no known allergies. Current Outpatient Medications on File Prior to Visit  Medication Sig Dispense Refill   Accu-Chek Softclix Lancets lancets Use as instructed 100 each 12   aspirin  EC 81 MG tablet Take 1 tablet (81 mg total) by mouth daily. 30 tablet 11   blood glucose meter kit and supplies Dispense based on patient and insurance preference. Use up to four times daily as directed. (FOR ICD-10 E10.9, E11.9). 1 each 0   carvedilol  (COREG ) 3.125 MG tablet Take 1 tablet by mouth twice daily with a meal 60 tablet 3   glucose blood (ACCU-CHEK GUIDE TEST) test strip USE TO CHECK BLOOD SUGAR UP TO FOUR TIMES DAILY AS DIRECTED. 100 strip 3   losartan -hydrochlorothiazide  (HYZAAR) 100-25 MG tablet Take 1 tablet by mouth daily. 90 tablet 3   Multiple Vitamin (MULTIVITAMIN) tablet Take 1 tablet by mouth daily.     mupirocin  ointment (BACTROBAN ) 2 % Apply 1 Application topically 2 (two) times daily. 22 g 2   tirzepatide (MOUNJARO) 10 MG/0.5ML Pen Inject 10 mg into the skin once a week. 6 mL 2   Cholecalciferol  1.25 MG (50000 UT) TABS 50,000 units PO qwk for 8 weeks. (Patient not taking: Reported on 08/03/2023) 8 tablet 0   metFORMIN  (GLUCOPHAGE -XR) 500 MG 24 hr tablet Take 1 tablet (500 mg total) by mouth 2 (two) times daily. 360 tablet 3   No current facility-administered medications on file prior to visit.    Review of Systems  Constitutional:  Negative for chills  and fever.  Respiratory:  Negative for cough.   Cardiovascular:  Negative for chest pain and palpitations.  Gastrointestinal:  Negative for nausea and vomiting.      Objective:    BP (!) 118/90 Comment: right arm  Pulse 88   Temp 98.2 F (36.8 C) (Oral)   Ht 6\' 3"  (1.905 m)   Wt 284 lb 3.2 oz (128.9 kg)   SpO2 98%   BMI 35.52 kg/m  BP Readings from Last 3 Encounters:  08/03/23 (!) 118/90  04/27/23 125/86  01/19/23 122/78   Wt Readings from Last 3 Encounters:  08/03/23 284 lb 3.2 oz (128.9  kg)  04/27/23 296 lb 9.6 oz (134.5 kg)  01/19/23 286 lb 3.2 oz (129.8 kg)    Physical Exam Vitals reviewed.  Constitutional:      Appearance: He is well-developed.  Cardiovascular:     Rate and Rhythm: Regular rhythm.     Heart sounds: Normal heart sounds.  Pulmonary:     Effort: Pulmonary effort is normal. No respiratory distress.     Breath sounds: Normal breath sounds. No wheezing, rhonchi or rales.  Skin:    General: Skin is warm and dry.  Neurological:     Mental Status: He is alert.  Psychiatric:        Speech: Speech normal.        Behavior: Behavior normal.

## 2023-08-03 NOTE — Assessment & Plan Note (Signed)
 Chronic,  overall stable.  Discussed elevation of diastolic blood pressure.  Reviewed previous echocardiogram, with known grade 1 diastolic dysfunction.  With soft systolic blood pressure,  we would defer antihypertensive changes at this time and continue to monitor .  continue HCTZ 25 mg -  losartan  100 mg daily, Coreg  3.125 mg BID.

## 2023-08-07 ENCOUNTER — Telehealth: Payer: Self-pay | Admitting: Family

## 2023-08-07 ENCOUNTER — Other Ambulatory Visit: Payer: Self-pay | Admitting: Family

## 2023-08-07 DIAGNOSIS — Z125 Encounter for screening for malignant neoplasm of prostate: Secondary | ICD-10-CM

## 2023-08-07 NOTE — Telephone Encounter (Signed)
 noted

## 2023-08-07 NOTE — Telephone Encounter (Signed)
 Patient need orders

## 2023-08-09 ENCOUNTER — Other Ambulatory Visit (INDEPENDENT_AMBULATORY_CARE_PROVIDER_SITE_OTHER)

## 2023-08-09 DIAGNOSIS — Z125 Encounter for screening for malignant neoplasm of prostate: Secondary | ICD-10-CM | POA: Diagnosis not present

## 2023-08-09 DIAGNOSIS — E119 Type 2 diabetes mellitus without complications: Secondary | ICD-10-CM

## 2023-08-09 LAB — LIPID PANEL
Cholesterol: 122 mg/dL (ref 0–200)
HDL: 33.6 mg/dL — ABNORMAL LOW (ref >39.00)
LDL Cholesterol: 52 mg/dL (ref 0–99)
NonHDL: 88.6
Total CHOL/HDL Ratio: 4
Triglycerides: 184 mg/dL — ABNORMAL HIGH (ref 0.0–149.0)
VLDL: 36.8 mg/dL (ref 0.0–40.0)

## 2023-08-09 LAB — PSA: PSA: 0.31 ng/mL (ref 0.10–4.00)

## 2023-08-09 NOTE — Addendum Note (Signed)
 Addended by: Lindle Rhea on: 08/09/2023 07:39 AM   Modules accepted: Orders

## 2023-08-10 ENCOUNTER — Ambulatory Visit: Payer: Self-pay | Admitting: Family

## 2023-08-30 DIAGNOSIS — E119 Type 2 diabetes mellitus without complications: Secondary | ICD-10-CM | POA: Diagnosis not present

## 2023-08-30 DIAGNOSIS — M2241 Chondromalacia patellae, right knee: Secondary | ICD-10-CM | POA: Diagnosis not present

## 2023-09-03 ENCOUNTER — Encounter: Payer: Self-pay | Admitting: Family

## 2023-09-04 ENCOUNTER — Other Ambulatory Visit: Payer: Self-pay | Admitting: Family

## 2023-09-04 DIAGNOSIS — E119 Type 2 diabetes mellitus without complications: Secondary | ICD-10-CM

## 2023-09-04 MED ORDER — METFORMIN HCL ER 500 MG PO TB24: 1000.0000 mg | ORAL_TABLET | Freq: Two times a day (BID) | ORAL | 3 refills | Status: AC

## 2023-09-12 DIAGNOSIS — M25561 Pain in right knee: Secondary | ICD-10-CM | POA: Diagnosis not present

## 2023-09-20 DIAGNOSIS — M25561 Pain in right knee: Secondary | ICD-10-CM | POA: Diagnosis not present

## 2023-09-28 DIAGNOSIS — M25561 Pain in right knee: Secondary | ICD-10-CM | POA: Diagnosis not present

## 2023-10-04 ENCOUNTER — Other Ambulatory Visit: Payer: Self-pay | Admitting: Family

## 2023-10-04 DIAGNOSIS — I10 Essential (primary) hypertension: Secondary | ICD-10-CM

## 2023-10-08 ENCOUNTER — Telehealth

## 2023-10-08 ENCOUNTER — Telehealth: Admitting: Physician Assistant

## 2023-10-08 DIAGNOSIS — S80862A Insect bite (nonvenomous), left lower leg, initial encounter: Secondary | ICD-10-CM | POA: Diagnosis not present

## 2023-10-08 DIAGNOSIS — L0390 Cellulitis, unspecified: Secondary | ICD-10-CM

## 2023-10-08 DIAGNOSIS — W57XXXA Bitten or stung by nonvenomous insect and other nonvenomous arthropods, initial encounter: Secondary | ICD-10-CM

## 2023-10-08 MED ORDER — CEPHALEXIN 500 MG PO CAPS: 500.0000 mg | ORAL_CAPSULE | Freq: Three times a day (TID) | ORAL | 0 refills | Status: AC

## 2023-10-08 NOTE — Progress Notes (Signed)
 E-Visit for Cellulitis  We are sorry that you are not feeling well. Here is how we plan to help!  Based on what you shared with me it looks like you have cellulitis from a insect bite.  Cellulitis looks like areas of skin redness, swelling, and warmth; it develops as a result of bacteria entering under the skin. Little red spots and/or bleeding can be seen in skin, and tiny surface sacs containing fluid can occur. Fever can be present.  I have prescribed:  Keflex  500mg  take one by mouth three times a day for 7 days  HOME CARE:  Take your medications as ordered and take all of them, even if the skin irritation appears to be healing.   GET HELP RIGHT AWAY IF:  Symptoms that don't begin to go away within 48 hours. Severe redness persists or worsens If the area turns color, spreads or swells. If it blisters and opens, develops yellow-brown crust or bleeds. You develop a fever or chills. If the pain increases or becomes unbearable.  Are unable to keep fluids and food down.  MAKE SURE YOU   Understand these instructions. Will watch your condition. Will get help right away if you are not doing well or get worse.  Thank you for choosing an e-visit.  Your e-visit answers were reviewed by a board certified advanced clinical practitioner to complete your personal care plan. Depending upon the condition, your plan could have included both over the counter or prescription medications.  Please review your pharmacy choice. Make sure the pharmacy is open so you can pick up prescription now. If there is a problem, you may contact your provider through [employer] and have the prescription routed to another pharmacy.  Your safety is important to us . If you have drug allergies check your prescription carefully.   For the next 24 hours you can use MyChart to ask questions about today's visit, request a non-urgent call back, or ask for a work or school excuse. You will get an email in the next  two days asking about your experience. I hope that your e-visit has been valuable and will speed your recovery.     I have spent 5 minutes in review of e-visit questionnaire, review and updating patient chart, medical decision making and response to patient.   Delon CHRISTELLA Dickinson, PA-C

## 2023-10-09 ENCOUNTER — Telehealth

## 2023-10-11 DIAGNOSIS — M25561 Pain in right knee: Secondary | ICD-10-CM | POA: Diagnosis not present

## 2023-10-18 DIAGNOSIS — M25561 Pain in right knee: Secondary | ICD-10-CM | POA: Diagnosis not present

## 2023-10-30 DIAGNOSIS — D2262 Melanocytic nevi of left upper limb, including shoulder: Secondary | ICD-10-CM | POA: Diagnosis not present

## 2023-10-30 DIAGNOSIS — D2261 Melanocytic nevi of right upper limb, including shoulder: Secondary | ICD-10-CM | POA: Diagnosis not present

## 2023-10-30 DIAGNOSIS — D225 Melanocytic nevi of trunk: Secondary | ICD-10-CM | POA: Diagnosis not present

## 2023-10-30 DIAGNOSIS — D2272 Melanocytic nevi of left lower limb, including hip: Secondary | ICD-10-CM | POA: Diagnosis not present

## 2023-11-13 IMAGING — CT CT HEART MORP W/ CTA COR W/ SCORE W/ CA W/CM &/OR W/O CM
1 of 15 series · 2 of 20 positions shown, 3 images · non-contrast
Comparison: None.

Addendum:
CLINICAL DATA: Chest pain

EXAM:
Cardiac/Coronary  CTA
TECHNIQUE: The patient was scanned on a Siemens Somatom go.Top scanner.

[Series 34: ms multiphase cta coronary 0.60 · axial · 0.41mm/px · z∈[-1118,-1076]mm · 2 of 2512 slices shown, 3 images]
[im 838/2512  vessel]
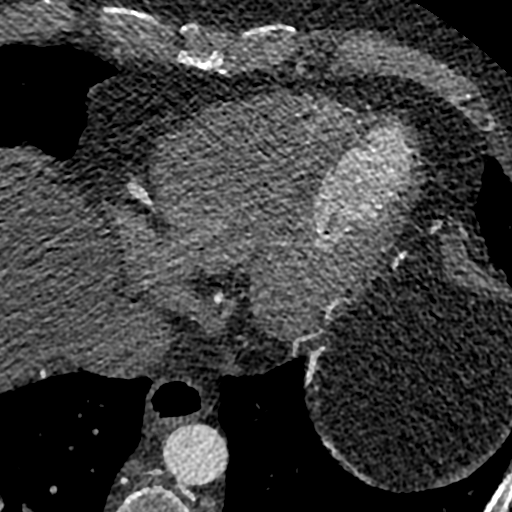
[im 838/2512  lung]
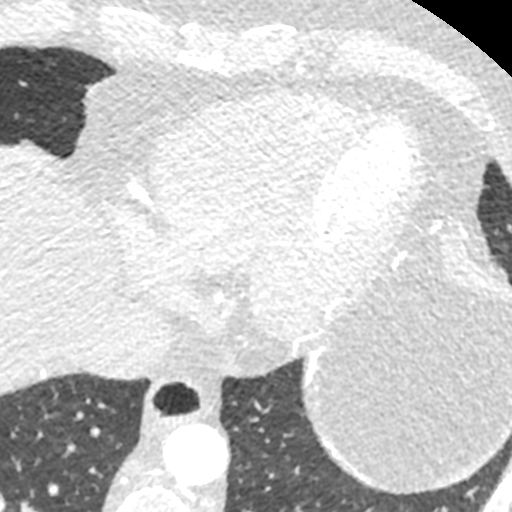
[im 1675/2512  vessel]
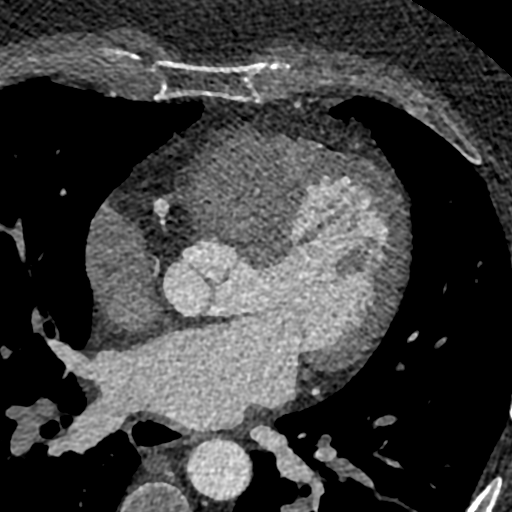

[2 of 20 positions shown; findings below may reference images not displayed]



Aortic Valve:  Trileaflet.  No calcifications.

Coronary Arteries:  Normal coronary origin.  Right dominance.

RCA is a dominant artery that gives rise to PDA and PLA. There is no
plaque.

Left main is a large artery that gives rise to LAD and LCX arteries.
There is no LM disease.

LAD has no plaque.

LCX is a non-dominant artery that gives rise to two obtuse marginal
branches. There is no plaque.

Other findings:

Normal pulmonary vein drainage into the left atrium.

Normal left atrial appendage without a thrombus.

Normal size of the pulmonary artery.
IMPRESSION: 1. Normal coronary calcium score of 0. Patient is a low risk for
coronary events.

2. Normal coronary origin with right dominance.

3. No evidence of CAD.

4. CAD-RADS 0. No evidence of CAD (0%). Consider non-atherosclerotic
causes of chest pain.

EXAM:
OVER-READ INTERPRETATION  CT CHEST

The following report is an over-read performed by radiologist Dr.
does not include interpretation of cardiac or coronary anatomy or
pathology. The coronary calcium score/coronary CTA interpretation by
the cardiologist is attached.
FINDINGS: No suspicious nodules, masses, or infiltrates are identified in the
visualized portion of the lungs. No pleural fluid seen.

The visualized portions of the mediastinum and chest wall are
unremarkable.
IMPRESSION: No significant non-cardiac abnormality identified.



Aortic Valve:  Trileaflet.  No calcifications.

Coronary Arteries:  Normal coronary origin.  Right dominance.

RCA is a dominant artery that gives rise to PDA and PLA. There is no
plaque.

Left main is a large artery that gives rise to LAD and LCX arteries.
There is no LM disease.

LAD has no plaque.

LCX is a non-dominant artery that gives rise to two obtuse marginal
branches. There is no plaque.

Other findings:

Normal pulmonary vein drainage into the left atrium.

Normal left atrial appendage without a thrombus.

Normal size of the pulmonary artery.
IMPRESSION: 1. Normal coronary calcium score of 0. Patient is a low risk for
coronary events.

2. Normal coronary origin with right dominance.

3. No evidence of CAD.

4. CAD-RADS 0. No evidence of CAD (0%). Consider non-atherosclerotic
causes of chest pain.

## 2023-12-01 ENCOUNTER — Encounter

## 2023-12-01 DIAGNOSIS — S60861A Insect bite (nonvenomous) of right wrist, initial encounter: Secondary | ICD-10-CM | POA: Diagnosis not present

## 2023-12-07 ENCOUNTER — Encounter: Payer: Self-pay | Admitting: Family

## 2023-12-07 ENCOUNTER — Ambulatory Visit: Admitting: Family

## 2023-12-07 VITALS — BP 128/88 | HR 80 | Temp 98.2°F | Ht 75.0 in | Wt 287.0 lb

## 2023-12-07 DIAGNOSIS — T63481D Toxic effect of venom of other arthropod, accidental (unintentional), subsequent encounter: Secondary | ICD-10-CM

## 2023-12-07 DIAGNOSIS — M25561 Pain in right knee: Secondary | ICD-10-CM | POA: Diagnosis not present

## 2023-12-07 DIAGNOSIS — G8929 Other chronic pain: Secondary | ICD-10-CM | POA: Diagnosis not present

## 2023-12-07 DIAGNOSIS — E119 Type 2 diabetes mellitus without complications: Secondary | ICD-10-CM | POA: Diagnosis not present

## 2023-12-07 DIAGNOSIS — Z7985 Long-term (current) use of injectable non-insulin antidiabetic drugs: Secondary | ICD-10-CM

## 2023-12-07 DIAGNOSIS — T63481A Toxic effect of venom of other arthropod, accidental (unintentional), initial encounter: Secondary | ICD-10-CM | POA: Insufficient documentation

## 2023-12-07 LAB — POCT GLYCOSYLATED HEMOGLOBIN (HGB A1C): Hemoglobin A1C: 6.2 % — AB (ref 4.0–5.6)

## 2023-12-07 MED ORDER — EPINEPHRINE 0.3 MG/0.3ML IJ SOAJ: 0.3000 mg | INTRAMUSCULAR | 1 refills | Status: AC | PRN

## 2023-12-07 MED ORDER — MELOXICAM 7.5 MG PO TABS: 7.5000 mg | ORAL_TABLET | Freq: Two times a day (BID) | ORAL | Status: AC | PRN

## 2023-12-07 MED ORDER — TIRZEPATIDE 12.5 MG/0.5ML ~~LOC~~ SOAJ: 12.5000 mg | SUBCUTANEOUS | 2 refills | Status: AC

## 2023-12-07 NOTE — Patient Instructions (Addendum)
 Please read all the information regarding SAFE use of autoinjector Epi pen.   Mobic  provided for as needed use.   A couple of points in regards to meloxicam  ( Mobic ) -  This medication is not intended for daily , long term use. It is a potent anti inflammatory ( NSAID), and my intention is for you take as needed for moderate to severe pain. If you find yourself using daily, please let me know.   Please takes Mobic  ( meloxicam ) with FOOD since it is an anti-inflammatory as it can cause a GI bleed or ulcer. If you have a history of GI bleed or ulcer, please do NOT take.  Do no take over the counter aleve, motrin, advil, goody's powder for pain as they are also NSAIDs, and they are  in the same class as Mobic   Lastly, we will need to monitor kidney function while on Mobic , and if we were to see any decline in kidney function in the future, we would have to discontinue this medication.

## 2023-12-07 NOTE — Progress Notes (Unsigned)
 Assessment & Plan:  Diabetes mellitus without complication (HCC) Assessment & Plan: Chronic, stable.  Increase Mounjaro  to 12.5 mg for additional weight management  Orders: -     Tirzepatide ; Inject 12.5 mg into the skin once a week.  Dispense: 6 mL; Refill: 2 -     POCT glycosylated hemoglobin (Hb A1C)  Chronic pain of right knee Assessment & Plan: Fortunately resolved.  Provided meloxicam  7.5 mg to take as needed.  Counseled on risk of long-term use, gastritis, PUD.  Orders: -     Meloxicam ; Take 1 tablet (7.5 mg total) by mouth 2 (two) times daily as needed for pain.  Allergic reaction to insect sting, accidental or unintentional, subsequent encounter Assessment & Plan:  No systemic allergic symptoms are present. Discussed potential for more severe future reactions due to sensitization. Discontinue Keflex  as the infection appears resolved. Prescribe an epinephrine  auto-injector for emergency use. Advise using Benadryl  post-sting.  Orders: -     EPINEPHrine ; Inject 0.3 mg into the muscle as needed for anaphylaxis.  Dispense: 1 each; Refill: 1     Return precautions given.   Risks, benefits, and alternatives of the medications and treatment plan prescribed today were discussed, and patient expressed understanding.   Education regarding symptom management and diagnosis given to patient on AVS either electronically or printed.  No follow-ups on file.  Todd Northern, FNP  Subjective:    Patient ID: Todd Willis, male    DOB: 02/06/1971, 53 y.o.   MRN: [id number]  CC: Todd Willis is a 53 y.o. male who presents today for follow up.   HPI: HPI Discussed the use of AI scribe software for clinical note transcription with the patient, who gave verbal consent to proceed.  History of Present Illness   Todd Willis is a 53 year old male with a history of allergic reactions to insect stings who presents with concerns about a recent wasp sting and knee  pain.  He experienced a wasp sting over the weekend, leading to significant swelling and redness in his right arm. The symptoms began almost immediately after the sting and extended up his arm. He denies difficulty breathing or tongue swelling, but reported that itching started later. He contacted [facility] and was prescribed Keflex , which he started on Saturday evening. He has been taking it but feels it is no longer necessary as the infection seems to be resolving. He has a history of severe reactions to insect stings.  He requests an epipen .   He has a history of intermittent knee pain, particularly in the right knee. In June, he experienced severe pain that led to an emergency visit, where imaging revealed arthritic changes. He underwent physical therapy and was prescribed meloxicam , which he took for about seven to eight days until the pain subsided. The pain is often triggered by activities such as stepping on uneven ground or prolonged driving.   He has been on Mounjaro  10 mg for a while, which he administers in his abdomen to avoid nausea. He is considering increasing the dose to 12.5 mg to aid in weight management.     Right knee osteoarthritis   Chronic pain with flares due to osteoarthritis is noted, with imaging showing arthritic changes. Previous use of meloxicam  was effective. Prescribe meloxicam  for flares, to be taken with food. Encourage continuation of physical therapy exercises.  Type 2 diabetes mellitus   Diabetes is well-controlled with the current regimen. He prefers abdominal administration due to nausea. Considering a dose  increase for better control, increase Mounjaro  dose to 12.5 mg. Perform an A1c test today and continue regular blood glucose monitoring.  Hypertension   Blood pressure is within range and managed with carvedilol . Discussed the option of an extended-release formulation. Continue the current carvedilol  regimen and consider the extended-release formulation for  convenience.  Hyperlipidemia   Lipid levels are well-controlled with statin therapy, with a recent LDL of 52. Continue current statin therapy.  Seasonal allergic rhinitis   Symptoms are effectively managed with Zyrtec. Continue Zyrtec during allergy season.       E visit 10/08/23 for insect bite; started  Seen by [facility] 6 /2025 ; he had right knee pain.  Knee pain improved with mobic  7.5mg  BID and PT.   Allergies: Patient has no known allergies. Current Outpatient Medications on File Prior to Visit  Medication Sig Dispense Refill   cetirizine (ZYRTEC) 5 MG tablet Take 5 mg by mouth daily.     Accu-Chek Softclix Lancets lancets Use as instructed 100 each 12   aspirin  EC 81 MG tablet Take 1 tablet (81 mg total) by mouth daily. 30 tablet 11   blood glucose meter kit and supplies Dispense based on patient and insurance preference. Use up to four times daily as directed. (FOR ICD-10 E10.9, E11.9). 1 each 0   carvedilol  (COREG ) 3.125 MG tablet TAKE 1 TABLET BY MOUTH TWICE DAILY WITH A MEAL 180 tablet 3   Cholecalciferol  1.25 MG (50000 UT) TABS 50,000 units PO qwk for 8 weeks. (Patient not taking: Reported on 08/03/2023) 8 tablet 0   glucose blood (ACCU-CHEK GUIDE TEST) test strip USE TO CHECK BLOOD SUGAR UP TO FOUR TIMES DAILY AS DIRECTED. 100 strip 3   losartan -hydrochlorothiazide  (HYZAAR) 100-25 MG tablet Take 1 tablet by mouth daily. 90 tablet 3   metFORMIN  (GLUCOPHAGE -XR) 500 MG 24 hr tablet Take 2 tablets (1,000 mg total) by mouth 2 (two) times daily. 360 tablet 3   Multiple Vitamin (MULTIVITAMIN) tablet Take 1 tablet by mouth daily.     mupirocin  ointment (BACTROBAN ) 2 % Apply 1 Application topically 2 (two) times daily. 22 g 2   pantoprazole  (PROTONIX ) 40 MG tablet TAKE 1 TABLET(40 MG) BY MOUTH DAILY ON AN EMPTY STOMACH 90 tablet 3   rosuvastatin  (CRESTOR ) 5 MG tablet TAKE  1 TABLET BY MOUTH DAILY 90 tablet 3   No current facility-administered medications on file prior to visit.     Review of Systems  Constitutional:  Negative for chills and fever.  Respiratory:  Negative for cough.   Cardiovascular:  Negative for chest pain and palpitations.  Gastrointestinal:  Negative for nausea and vomiting.  Musculoskeletal:  Negative for arthralgias (resolved).      Objective:    BP 128/88   Pulse 80   Temp 98.2 F (36.8 C) (Oral)   Ht 6' 3 (1.905 m)   Wt 287 lb (130.2 kg)   SpO2 96%   BMI 35.87 kg/m  BP Readings from Last 3 Encounters:  12/07/23 128/88  08/03/23 (!) 118/90  04/27/23 125/86   Wt Readings from Last 3 Encounters:  12/07/23 287 lb (130.2 kg)  08/03/23 284 lb 3.2 oz (128.9 kg)  04/27/23 296 lb 9.6 oz (134.5 kg)    Physical Exam Vitals reviewed.  Constitutional:      Appearance: He is well-developed.  Cardiovascular:     Rate and Rhythm: Regular rhythm.     Heart sounds: Normal heart sounds.  Pulmonary:     Effort: Pulmonary effort is  normal. No respiratory distress.     Breath sounds: Normal breath sounds. No wheezing, rhonchi or rales.  Musculoskeletal:     Right upper arm: No swelling, edema, tenderness or bony tenderness.     Comments: No erythema of forearm .  Skin:    General: Skin is warm and dry.  Neurological:     Mental Status: He is alert.  Psychiatric:        Speech: Speech normal.        Behavior: Behavior normal.

## 2023-12-11 NOTE — Assessment & Plan Note (Signed)
 Chronic, stable.  Increase Mounjaro  to 12.5 mg for additional weight management

## 2023-12-11 NOTE — Assessment & Plan Note (Signed)
  No systemic allergic symptoms are present. Discussed potential for more severe future reactions due to sensitization. Discontinue Keflex  as the infection appears resolved. Prescribe an epinephrine  auto-injector for emergency use. Advise using Benadryl  post-sting.

## 2023-12-11 NOTE — Assessment & Plan Note (Signed)
 Fortunately resolved.  Provided meloxicam  7.5 mg to take as needed.  Counseled on risk of long-term use, gastritis, PUD.

## 2024-04-11 ENCOUNTER — Encounter: Payer: Self-pay | Admitting: Family

## 2024-04-11 ENCOUNTER — Ambulatory Visit: Admitting: Family

## 2024-04-11 VITALS — BP 118/80 | HR 68 | Temp 98.3°F | Ht 75.0 in | Wt 275.6 lb

## 2024-04-11 DIAGNOSIS — Z7985 Long-term (current) use of injectable non-insulin antidiabetic drugs: Secondary | ICD-10-CM

## 2024-04-11 DIAGNOSIS — Z23 Encounter for immunization: Secondary | ICD-10-CM

## 2024-04-11 DIAGNOSIS — E119 Type 2 diabetes mellitus without complications: Secondary | ICD-10-CM | POA: Diagnosis not present

## 2024-04-11 DIAGNOSIS — R7309 Other abnormal glucose: Secondary | ICD-10-CM

## 2024-04-11 DIAGNOSIS — I10 Essential (primary) hypertension: Secondary | ICD-10-CM | POA: Diagnosis not present

## 2024-04-11 DIAGNOSIS — R252 Cramp and spasm: Secondary | ICD-10-CM

## 2024-04-11 LAB — POCT GLYCOSYLATED HEMOGLOBIN (HGB A1C): HbA1c POC (<> result, manual entry): 6.1 %

## 2024-04-11 LAB — COMPREHENSIVE METABOLIC PANEL WITH GFR
ALT: 28 U/L (ref 3–53)
AST: 23 U/L (ref 5–37)
Albumin: 4.6 g/dL (ref 3.5–5.2)
Alkaline Phosphatase: 48 U/L (ref 39–117)
BUN: 22 mg/dL (ref 6–23)
CO2: 28 meq/L (ref 19–32)
Calcium: 9.6 mg/dL (ref 8.4–10.5)
Chloride: 102 meq/L (ref 96–112)
Creatinine, Ser: 0.95 mg/dL (ref 0.40–1.50)
GFR: 91.36 mL/min
Glucose, Bld: 130 mg/dL — ABNORMAL HIGH (ref 70–99)
Potassium: 4.1 meq/L (ref 3.5–5.1)
Sodium: 140 meq/L (ref 135–145)
Total Bilirubin: 0.7 mg/dL (ref 0.2–1.2)
Total Protein: 6.9 g/dL (ref 6.0–8.3)

## 2024-04-11 LAB — B12 AND FOLATE PANEL
Folate: >23.4 ng/mL
Vitamin B-12: 418 pg/mL (ref 211–911)

## 2024-04-11 LAB — MAGNESIUM: Magnesium: 2.1 mg/dL (ref 1.5–2.5)

## 2024-04-11 MED ORDER — LOSARTAN POTASSIUM-HCTZ 50-12.5 MG PO TABS: 1.0000 | ORAL_TABLET | Freq: Every day | ORAL | 2 refills | Status: DC

## 2024-04-11 NOTE — Patient Instructions (Addendum)
 Trial decrease of HALF hyzaar and monitor blood pressure.   It is imperative that you are seen AT least twice per year for labs and monitoring. Monitor blood pressure at home and me 5-6 reading on separate days. Goal is less than 120/80, based on newest guidelines, however we certainly want to be less than 130/80;  if persistently higher, please make sooner follow up appointment so we can recheck you blood pressure and manage/ adjust medications.

## 2024-04-11 NOTE — Progress Notes (Signed)
 "  Assessment & Plan:  Diabetes mellitus without complication (HCC) Assessment & Plan: Excellent glycemic control.  Continue Mounjaro  12.5 mg   Orders: -     Iron, TIBC and Ferritin Panel -     B12 and Folate Panel -     Magnesium -     Comprehensive metabolic panel with GFR  Elevated glucose -     POCT glycosylated hemoglobin (Hb A1C)  Encounter for administration of vaccine -     Hepatitis B surface antibody,quantitative  Essential hypertension Assessment & Plan: Chronic, stable Occasional lightheadedness , likely exacerbated in setting of intentional weight loss . Trial Decrease Hyzaar (losartan /hydrochlorothiazide ) to half dose. Monitor blood pressure at home. Follow-up scheduled in 2 weeks to 1 month to reassess blood pressure.   Leg cramps Assessment & Plan: Sporadic. less likely statin side effect.  He is on a diuretic. Pending lab evaluation.  Consider B complex vitamins, and/or magnesium.   Other orders -     Losartan  Potassium-HCTZ; Take 1 tablet by mouth daily.  Dispense: 90 tablet; Refill: 2     Return precautions given.   Risks, benefits, and alternatives of the medications and treatment plan prescribed today were discussed, and patient expressed understanding.   Education regarding symptom management and diagnosis given to patient on AVS either electronically or printed.  No follow-ups on file.  Rollene Northern, FNP  Subjective:    Patient ID: Todd Willis, male    DOB: 1970/04/25, 54 y.o.   MRN: [id number]  CC: Todd Willis is a 54 y.o. male who presents today for follow up.   HPI: HPI Discussed the use of AI scribe software for clinical note transcription with the patient, who gave verbal consent to proceed.  History of Present Illness   Todd Willis is a 54 year old male with diabetes and hypertension who presents for follow-up regarding weight management and leg cramps.  He has experienced weight loss, although his weight is  higher in the clinic than at home. He is targeting a weight of 250 pounds and notes that his weight loss has slowed recently. He is on metformin , and his last A1c was 6.2%.  He experiences leg cramps and restless leg symptoms, particularly at night. The cramps are sporadic and do not occur every night, but they can keep him awake. He associates the cramps with inadequate hydration and has considered magnesium supplementation.  He has a history of hypertension and is currently on a combination pill of losartan  100 mg and hydrochlorothiazide  25 mg. He experiences occasional lightheadedness, especially when standing up quickly. Denies syncope, CP           Allergies: Patient has no known allergies. Medications Ordered Prior to Encounter[1]  Review of Systems  Constitutional:  Negative for chills and fever.  Respiratory:  Negative for cough and shortness of breath.   Cardiovascular:  Negative for chest pain, palpitations and leg swelling.  Gastrointestinal:  Negative for nausea and vomiting.      Objective:    BP 118/80   Pulse 68   Temp 98.3 F (36.8 C) (Oral)   Ht 6' 3 (1.905 m)   Wt 275 lb 9.6 oz (125 kg)   SpO2 98%   BMI 34.45 kg/m  BP Readings from Last 3 Encounters:  04/11/24 118/80  12/07/23 128/88  08/03/23 (!) 118/90   Wt Readings from Last 3 Encounters:  04/11/24 275 lb 9.6 oz (125 kg)  12/07/23 287 lb (130.2 kg)  08/03/23 284 lb  3.2 oz (128.9 kg)    Physical Exam Vitals reviewed.  Constitutional:      Appearance: He is well-developed.  Cardiovascular:     Rate and Rhythm: Regular rhythm.     Heart sounds: Normal heart sounds.     Comments: No LE edema, palpable cords or masses. No erythema or increased warmth. No asymmetry in calf size when compared bilaterally LE hair growth symmetric and present. No discoloration or varicosities noted. LE warm and palpable pedal pulses.  Pulmonary:     Effort: Pulmonary effort is normal. No respiratory distress.      Breath sounds: Normal breath sounds. No wheezing, rhonchi or rales.  Musculoskeletal:     Right lower leg: No edema.     Left lower leg: No edema.  Skin:    General: Skin is warm and dry.  Neurological:     Mental Status: He is alert.  Psychiatric:        Speech: Speech normal.        Behavior: Behavior normal.            [1]  Current Outpatient Medications on File Prior to Visit  Medication Sig Dispense Refill   Accu-Chek Softclix Lancets lancets Use as instructed 100 each 12   aspirin  EC 81 MG tablet Take 1 tablet (81 mg total) by mouth daily. 30 tablet 11   blood glucose meter kit and supplies Dispense based on patient and insurance preference. Use up to four times daily as directed. (FOR ICD-10 E10.9, E11.9). 1 each 0   carvedilol  (COREG ) 3.125 MG tablet TAKE 1 TABLET BY MOUTH TWICE DAILY WITH A MEAL 180 tablet 3   cetirizine (ZYRTEC) 10 MG tablet Take 10 mg by mouth daily.     cetirizine (ZYRTEC) 5 MG tablet Take 5 mg by mouth daily.     Cholecalciferol  1.25 MG (50000 UT) TABS 50,000 units PO qwk for 8 weeks. 8 tablet 0   EPINEPHrine  0.3 mg/0.3 mL IJ SOAJ injection Inject 0.3 mg into the muscle as needed for anaphylaxis. 1 each 1   glucose blood (ACCU-CHEK GUIDE TEST) test strip USE TO CHECK BLOOD SUGAR UP TO FOUR TIMES DAILY AS DIRECTED. 100 strip 3   meloxicam  (MOBIC ) 7.5 MG tablet Take 1 tablet (7.5 mg total) by mouth 2 (two) times daily as needed for pain.     meloxicam  (MOBIC ) 7.5 MG tablet Take 7.5 mg by mouth 2 (two) times daily with a meal.     metFORMIN  (GLUCOPHAGE ) 500 MG tablet Take by mouth.     metFORMIN  (GLUCOPHAGE -XR) 500 MG 24 hr tablet Take 2 tablets (1,000 mg total) by mouth 2 (two) times daily. 360 tablet 3   Multiple Vitamin (MULTIVITAMIN) tablet Take 1 tablet by mouth daily.     mupirocin  ointment (BACTROBAN ) 2 % Apply 1 Application topically 2 (two) times daily. 22 g 2   pantoprazole  (PROTONIX ) 40 MG tablet TAKE 1 TABLET(40 MG) BY MOUTH DAILY ON AN  EMPTY STOMACH 90 tablet 3   rosuvastatin  (CRESTOR ) 5 MG tablet TAKE  1 TABLET BY MOUTH DAILY 90 tablet 3   tirzepatide  (MOUNJARO ) 12.5 MG/0.5ML Pen Inject 12.5 mg into the skin once a week. 6 mL 2   metFORMIN  (GLUCOPHAGE -XR) 500 MG 24 hr tablet Take 500 mg by mouth 2 (two) times daily.     No current facility-administered medications on file prior to visit.   "

## 2024-04-11 NOTE — Assessment & Plan Note (Signed)
 Excellent glycemic control.  Continue Mounjaro  12.5 mg

## 2024-04-11 NOTE — Assessment & Plan Note (Signed)
 Sporadic. less likely statin side effect.  He is on a diuretic. Pending lab evaluation.  Consider B complex vitamins, and/or magnesium.

## 2024-04-11 NOTE — Assessment & Plan Note (Signed)
 Chronic, stable Occasional lightheadedness , likely exacerbated in setting of intentional weight loss . Trial Decrease Hyzaar (losartan /hydrochlorothiazide ) to half dose. Monitor blood pressure at home. Follow-up scheduled in 2 weeks to 1 month to reassess blood pressure.

## 2024-04-12 LAB — IRON,TIBC AND FERRITIN PANEL
%SAT: 16 % — ABNORMAL LOW (ref 20–48)
Ferritin: 17 ng/mL — ABNORMAL LOW (ref 38–380)
Iron: 67 ug/dL (ref 50–180)
TIBC: 413 ug/dL (ref 250–425)

## 2024-04-12 LAB — HEPATITIS B SURFACE ANTIBODY, QUANTITATIVE: Hep B S AB Quant (Post): <5 m[IU]/mL — ABNORMAL LOW

## 2024-04-14 ENCOUNTER — Ambulatory Visit: Payer: Self-pay | Admitting: Family

## 2024-04-22 ENCOUNTER — Other Ambulatory Visit: Payer: Self-pay | Admitting: Family

## 2024-04-22 MED ORDER — LOSARTAN POTASSIUM-HCTZ 100-25 MG PO TABS: 1.0000 | ORAL_TABLET | Freq: Every day | ORAL | 3 refills | Status: AC

## 2024-05-16 ENCOUNTER — Ambulatory Visit: Admitting: Family
